# Patient Record
Sex: Female | Born: 1970 | Race: White | Hispanic: No | Marital: Single | Smoking: Never smoker
Health system: Southern US, Community
[De-identification: ages and names within clinical notes are randomized; demographics above are authoritative.]

## PROBLEM LIST (undated history)

## (undated) DIAGNOSIS — S82892A Other fracture of left lower leg, initial encounter for closed fracture: Secondary | ICD-10-CM

## (undated) HISTORY — PX: ABDOMINAL HYSTERECTOMY: SHX81

---

## 1997-11-10 ENCOUNTER — Ambulatory Visit (HOSPITAL_COMMUNITY): Admission: RE | Admit: 1997-11-10 | Discharge: 1997-11-10 | Payer: Self-pay | Admitting: Family Medicine

## 1997-11-13 ENCOUNTER — Emergency Department (HOSPITAL_COMMUNITY): Admission: EM | Admit: 1997-11-13 | Discharge: 1997-11-13 | Payer: Self-pay | Admitting: Emergency Medicine

## 1998-08-08 ENCOUNTER — Encounter: Payer: Self-pay | Admitting: Emergency Medicine

## 1998-08-08 ENCOUNTER — Emergency Department (HOSPITAL_COMMUNITY): Admission: EM | Admit: 1998-08-08 | Discharge: 1998-08-08 | Payer: Self-pay | Admitting: Emergency Medicine

## 1998-08-11 ENCOUNTER — Inpatient Hospital Stay (HOSPITAL_COMMUNITY): Admission: RE | Admit: 1998-08-11 | Discharge: 1998-08-13 | Payer: Self-pay | Admitting: Orthopedic Surgery

## 1998-08-11 ENCOUNTER — Encounter: Payer: Self-pay | Admitting: Orthopedic Surgery

## 2003-07-09 ENCOUNTER — Other Ambulatory Visit: Admission: RE | Admit: 2003-07-09 | Discharge: 2003-07-09 | Payer: Self-pay | Admitting: Obstetrics and Gynecology

## 2003-12-01 ENCOUNTER — Ambulatory Visit (HOSPITAL_COMMUNITY): Admission: RE | Admit: 2003-12-01 | Discharge: 2003-12-01 | Payer: Self-pay | Admitting: Obstetrics and Gynecology

## 2004-04-24 HISTORY — PX: CHOLECYSTECTOMY: SHX55

## 2004-10-24 ENCOUNTER — Other Ambulatory Visit: Admission: RE | Admit: 2004-10-24 | Discharge: 2004-10-24 | Payer: Self-pay | Admitting: Obstetrics and Gynecology

## 2006-10-03 ENCOUNTER — Ambulatory Visit: Payer: Self-pay | Admitting: Internal Medicine

## 2006-10-03 LAB — CONVERTED CEMR LAB
ALT: 20 units/L (ref 0–40)
AST: 23 units/L (ref 0–37)
Albumin: 3.7 g/dL (ref 3.5–5.2)
Alkaline Phosphatase: 59 units/L (ref 39–117)
Basophils Absolute: 0.4 10*3/uL — ABNORMAL HIGH (ref 0.0–0.1)
Basophils Relative: 4.5 % — ABNORMAL HIGH (ref 0.0–1.0)
Bilirubin, Direct: 0.1 mg/dL (ref 0.0–0.3)
Eosinophils Absolute: 0.1 10*3/uL (ref 0.0–0.6)
Eosinophils Relative: 1.7 % (ref 0.0–5.0)
HCT: 41.3 % (ref 36.0–46.0)
Hemoglobin: 14.7 g/dL (ref 12.0–15.0)
Lipase: 21 units/L (ref 11.0–59.0)
Lymphocytes Relative: 21.5 % (ref 12.0–46.0)
MCHC: 35.6 g/dL (ref 30.0–36.0)
MCV: 87.7 fL (ref 78.0–100.0)
Monocytes Absolute: 0.9 10*3/uL — ABNORMAL HIGH (ref 0.2–0.7)
Monocytes Relative: 9.9 % (ref 3.0–11.0)
Neutro Abs: 5.4 10*3/uL (ref 1.4–7.7)
Neutrophils Relative %: 62.4 % (ref 43.0–77.0)
Platelets: 291 10*3/uL (ref 150–400)
RBC: 4.71 M/uL (ref 3.87–5.11)
RDW: 12.3 % (ref 11.5–14.6)
Total Bilirubin: 0.7 mg/dL (ref 0.3–1.2)
Total Protein: 6.8 g/dL (ref 6.0–8.3)
WBC: 8.6 10*3/uL (ref 4.5–10.5)

## 2006-10-04 ENCOUNTER — Ambulatory Visit: Payer: Self-pay | Admitting: Internal Medicine

## 2006-12-05 ENCOUNTER — Encounter: Admission: RE | Admit: 2006-12-05 | Discharge: 2006-12-05 | Payer: Self-pay | Admitting: Internal Medicine

## 2007-01-22 ENCOUNTER — Ambulatory Visit: Payer: Self-pay | Admitting: Internal Medicine

## 2007-02-01 ENCOUNTER — Ambulatory Visit: Payer: Self-pay | Admitting: Internal Medicine

## 2007-02-01 ENCOUNTER — Encounter: Payer: Self-pay | Admitting: Internal Medicine

## 2007-02-01 DIAGNOSIS — K449 Diaphragmatic hernia without obstruction or gangrene: Secondary | ICD-10-CM | POA: Insufficient documentation

## 2007-03-07 ENCOUNTER — Ambulatory Visit (HOSPITAL_COMMUNITY): Admission: RE | Admit: 2007-03-07 | Discharge: 2007-03-07 | Payer: Self-pay | Admitting: Surgery

## 2007-03-07 ENCOUNTER — Encounter (INDEPENDENT_AMBULATORY_CARE_PROVIDER_SITE_OTHER): Payer: Self-pay | Admitting: Surgery

## 2009-01-04 ENCOUNTER — Emergency Department (HOSPITAL_COMMUNITY): Admission: EM | Admit: 2009-01-04 | Discharge: 2009-01-04 | Payer: Self-pay | Admitting: Emergency Medicine

## 2009-04-24 HISTORY — PX: OTHER SURGICAL HISTORY: SHX169

## 2010-03-03 ENCOUNTER — Encounter (INDEPENDENT_AMBULATORY_CARE_PROVIDER_SITE_OTHER): Payer: Self-pay | Admitting: Obstetrics and Gynecology

## 2010-03-03 ENCOUNTER — Ambulatory Visit (HOSPITAL_COMMUNITY): Admission: RE | Admit: 2010-03-03 | Discharge: 2010-03-04 | Payer: Self-pay | Admitting: Obstetrics and Gynecology

## 2010-07-05 LAB — CBC
HCT: 42.8 % (ref 36.0–46.0)
MCH: 30.8 pg (ref 26.0–34.0)
MCHC: 33.7 g/dL (ref 30.0–36.0)
MCV: 91.6 fL (ref 78.0–100.0)
Platelets: 223 10*3/uL (ref 150–400)
Platelets: 239 10*3/uL (ref 150–400)
RDW: 13.1 % (ref 11.5–15.5)
RDW: 13.5 % (ref 11.5–15.5)
WBC: 12.8 10*3/uL — ABNORMAL HIGH (ref 4.0–10.5)
WBC: 9.3 10*3/uL (ref 4.0–10.5)

## 2010-07-05 LAB — COMPREHENSIVE METABOLIC PANEL
AST: 18 U/L (ref 0–37)
Albumin: 3.2 g/dL — ABNORMAL LOW (ref 3.5–5.2)
BUN: 6 mg/dL (ref 6–23)
Creatinine, Ser: 0.77 mg/dL (ref 0.4–1.2)
GFR calc Af Amer: >60 mL/min (ref >60)
Total Protein: 5.7 g/dL — ABNORMAL LOW (ref 6.0–8.3)

## 2010-07-05 LAB — SURGICAL PCR SCREEN: MRSA, PCR: NEGATIVE

## 2010-07-29 LAB — POCT URINALYSIS DIP (DEVICE)
Bilirubin Urine: NEGATIVE
Glucose, UA: NEGATIVE mg/dL
Nitrite: NEGATIVE
Specific Gravity, Urine: 1.01 (ref 1.005–1.030)

## 2010-09-06 NOTE — Assessment & Plan Note (Signed)
[facility]                         GASTROENTEROLOGY OFFICE NOTE   Brandi Perkins, Brandi Perkins                      MRN:          [id number]  DATE:10/03/2006                            DOB:          May 10, 1970    Brandi Perkins is a very nice 41 year old patient of Dr. Aida Puffer who  comes in with intermittent right upper quadrant abdominal pain.  The  pain is localized to the right costal margin, radiating laterally into  the back to the shoulder blade.  It occurs post prandially within 20  minutes of eating, mostly fatty meals or rich foods.  It has only once  woke her up at night.  Her symptoms are usually daytime.  There is a  strong family history of gallbladder disease in her mother, who had  diseased gallbladder, two sisters who had gallstones, her father, who  also had a cholecystectomy five months ago for stones, and his sisters  and her cousins  from father's side.  She denies a history of  gastroesophageal reflux.  She denies dysphagia or odynophagia.   MEDICATIONS:  1. Ortho-Micronor birth control 28-day supply.  2. Advil p.r.n.   Past history significant for laparoscopic surgeries for endometriosis in  August, 2005.   FAMILY HISTORY:  Heart disease in grandmother, breast cancer in aunt.  No family history of colon cancer.   SOCIAL HISTORY:  Single.  Works as an Administrator, arts.  Lives with her parents.  She  does not smoke.  Does not drink alcohol.   REVIEW OF SYSTEMS:  Eyeglasses, otherwise negative.   PHYSICAL EXAMINATION:  VITAL SIGNS:  Blood pressure 116/68, pulse 76.  Weight 199 pounds.  GENERAL:  She was alert, oriented, in no distress, somewhat overweight.  LUNGS:  Clear to auscultation.  NECK:  Supple.  No thyroid enlargement.  COR:  Normal S1 and S2.  ABDOMEN:  Soft with normoactive bowel sounds.  Very tender, overlying  the liver edge.  The liver edge is somewhat tender on inspiration.  There is no hepatomegaly.  Right lower quadrant was  normal.  RECTAL:  Not done.  EXTREMITIES:  No edema.   IMPRESSION:  A 40 year old white female with right upper quadrant  abdominal discomfort occurring post prandially, consistent with biliary  dysfunction, most likely cholelithiasis or possible acalculous  cholecystitis.  She has a strong family history of both sides of her  family.  The differential diagnosis also includes peptic ulcer disease,  Helicobacter pylori gastritis, hiatal hernia, or irritable bowel  syndrome.   PLAN:  1. Upper abdominal ultrasound.  2. Low fat diet.  3. Liver function tests, lipase today as well as CBC.  Depending on      the results of the upper abdominal ultrasound, she may need upper      endoscopy or HIDA scan.     Brandi Perkins. Brandi Chance, MD  Electronically Signed    DMB/MedQ  DD: 10/03/2006  DT: 10/04/2006  Job #: [id number]   cc:   Aida Puffer

## 2010-09-06 NOTE — Op Note (Signed)
NAME:  Brandi Perkins, Brandi Perkins               ACCOUNT NO.:  [id number]   MEDICAL RECORD NO.:  [id number]          PATIENT TYPE:  AMB   LOCATION:  DAY                          FACILITY:  [facility]   PHYSICIAN:  Ardeth Sportsman, MD     DATE OF BIRTH:  March 31, 1971   DATE OF PROCEDURE:  03/07/2007  DATE OF DISCHARGE:                               OPERATIVE REPORT   PRIMARY CARE PHYSICIAN:  Aida Puffer, MD.   GASTROENTEROLOGIST:  Hedwig Morton. Juanda Chance, MD.   PREOPERATIVE DIAGNOSIS:  Biliary dyskinesia.   POSTOPERATIVE DIAGNOSES:  1. Biliary dyskinesia.  2. 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  3. Steatohepatosis.   PROCEDURE PERFORMED:  Laparoscopic cholecystectomy and intraoperative  cholangiogram.   SURGEON:  Ardeth Sportsman, MD.   ASSISTANT:  Alfonse Ras, MD.   ANESTHESIA:  1. General anesthesia.  2. Local anesthetic in a field block around all port sites.   SPECIMENS:  Gallbladder.   DRAINS:  None.   ESTIMATED BLOOD LOSS:  Less than 5 ml.   COMPLICATIONS:  None apparent.   INDICATIONS:  [name] is a pleasant, 40 year old female, who has had  intermittent episodes of right upper quadrant abdominal pain that had  been intermittently worsening.  She has had an aggressive  gastroenterological workup that has otherwise been unrevealing.  Based  upon concerns, Dr. Juanda Chance sent the patient to me for a surgical  evaluation.  She had an abnormal gallbladder ejection fraction by HIDA  scan with reproduction of symptoms strongly suggested of biliary  dyskinesia despite the absence of stones.   The anatomy and physiology of hepatobiliary and pancreatic function was  discussed.  The pathophysiology of biliary dyskinesia with worsening,  debilitating pain and a calculus cholecystitis and other risks were  discussed.  Options were discussed, and recommendation was made for a  laparoscopic cholecystectomy with intraoperative cholangiogram.  Risks  such as stroke, heart attack, deep venous  thrombosis, pulmonary  embolism, and death were discussed.  Risks such as bleeding, need for  transfusion, wound infection, abscess, injury to other organs,  persistent pain (arguing if the gallbladder is not the true etiology),  and other risks were discussed.  Her risks of bile duct injury resulting  in the need of endoscopic extending or percutaneous drainage, or  operative reconstruction were discussed as well.  Questions answered and  she agreed to proceed.   OPERATIVE FINDINGS:  She did have some gallbladder wall thickening  concerning for chronic cholecystitis.  There was no evidence of any  active infection.  She did have a fatty liver consistent with  steatohepatosis.   DESCRIPTION OF PROCEDURE:  Informed consent was confirmed.  The patient  urinated just prior to going to the operating room.  She had sequential  compression devices active during the entire case.  She underwent  general anesthesia without any difficulty.  She was placed supine with  both arms tucked.  Her abdomen was prepped and draped in a sterile  fashion.   Entry was gained into the abdomen with the patient in steep reverse  Trendelenburg to right sit up with an  optimal entry technique placing a  5-mm port in the right upper quadrant.  Capnoperitoneum to 15 mmHg  provided good abdominal wall insufflation.  Under direct visualization,  5-mm ports were placed in the right flank and through the umbilicus, and  a 10-mm port was placed in the subxiphoid region.   The gallbladder fundus was grasped and elevated cephalad.  The  peritoneal coverage between the gallbladder and liver was freed off  using controlled cautery.  A circumferential dissection was done at the  infundibulum of the gallbladder and isolating two structures going from  the gallbladder down to the porta hepatis and the cystic artery with  anterior and posterior branches in the cystic duct, consistent with a  classic critical view.   One  clip on the gallbladder side of the anterior and posterior branches  and two clips slightly proximal were made on the cystic artery and the  anterior branch of the cystic artery was transected.  The posterior  branch was released using cautery since the clips were proximal to it to  a good result.  This left one structure going from the gallbladder down  to the porta hepatis and the cystic duct.  One clip on the infundibulum  was made and a partial cystotomy was performed.   A 5-French cholangiocatheter was placed through the right upper quadrant  stab incision, flushed, and entered in the cystic duct.  The  cholangiogram was run using dilute radiopaque contrast and continuous  fluoroscopy.  Contrast flowed well from a helical side branch consistent  with a cystic duct cannulization.  Contrast flowed well into the right  and left intrahepatic chains, across the common hepatic and bile ducts  with a slightly kinked ampulla, but good flow into the duodenum  consistent with a normal cholangiogram.  The cholangiocatheter was  removed.  Four clips were made in the cystic duct stumps as there was a  nice long cystic duct, and cystic duct transection was completed.   The gallbladder was freed from its remaining attachments on the liver  bed.  During dissection, there was a breach of the gallbladder wall and  there was some spillage of contrast of clear bile.  This was aspirate  controlled.  There were no stones that were exposed.  The gallbladder  was placed in an EndoCatch bag and removed out the subxiphoid port with  no dilation needed.   Copious irrigation of a liter was performed with clear return.  The  clips were intact to the cystic duct and arterial stumps, and hemostasis  was excellent.  There was no leak of bile.  Up to three abdominal ports  were removed, and there was no bleeding from the peritoneal or skin  site.  Capnoperitoneum was actively aspirated out and the umbilical port   was removed.  The skin was closed using a #4-0 Monocryl stitch.  A  sterile dressing was applied.  The patient was extubated and sent to the  recovery room in stable condition.   I explained the operative findings to the patient's mother, postsurgical  instructions were discussed with the patient just prior to surgery and  again with the patient's family.  Questions answered and she expressed  understanding and appreciation.      Ardeth Sportsman, MD  Electronically Signed     SCG/MEDQ  D:  03/07/2007  T:  03/07/2007  Job:  [id number]   cc:   Hedwig Morton. Juanda Chance, MD  [address]  [place]  [place] [address]   Aida Puffer  Fax: [phone]

## 2010-09-06 NOTE — Assessment & Plan Note (Signed)
[facility]                         GASTROENTEROLOGY OFFICE NOTE   NAME:Romer, EASTYN SKALLA                      MRN:          [id number]  DATE:01/22/2007                            DOB:          11/15/1970    Ms. Kern is a 40 year old white female.  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  She has a  very strong family history of gallbladder disease on both sides of her  family. Her ultrasound was normal, the HIDA scan was slightly abnormal  showing decreased ejection fraction, slightly below normal at 48%.  Her  liver function tests, amylase and lipase were normal.  She did well on a  strict low fat diet for about a month but in August started having again  right upper quadrant abdominal pain which became quite severe in the  last 2 weeks radiating to the right back.  There has been no fever,  jaundice.  She has not been able to eat anything for several days,  vomited shortly after eating oatmeal yesterday.   MEDICATIONS:  Advil and Aleve p.r.n. abdominal pain, birth control  pills.   PHYSICAL EXAMINATION:  Blood pressure 116/76, pulse 68, weight 195  pounds which represents 4 pound weight loss since last exam.  She was in  no distress today.  LUNGS:  Clear to auscultation.  COR:  Normal S1, normal S2.  ABDOMEN:  Soft, there is definite tenderness in the right upper quadrant  along the right costal margin, there is tenderness over the liver.  Liver edge itself was  at costal margin.  Left upper quadrant and left  lower quadrants were unremarkable.  Bowel sounds were normoactive.   IMPRESSION:  A 40 year old white female with recurrent right upper  quadrant abdominal pain radiating to the back resembling biliary colic.  She has strong family history of gallbladder disease and has had  slightly abnormal HIDA scan with 48% ejection fraction, normal being  more than 50%.  Her physical exam definitely  suggests tenderness  in  right upper quadrant.  Other than biliary colic for the pain gastritis,  duodenitis or possible irritable bowel syndrome could be another reason.   PLAN:  1. Upper endoscopy scheduled.  2. Referred for surgical consultation.  3. Phenergan 25 mg p.r.n. nausea.  4. Samples of Aciphex 20 mg daily, 2 boxes given to the patient.  5. Vicodin per generic, dispensed 15 one p.o. q.4-6 hours p.r.n.      abdominal pain.     Hedwig Morton. Juanda Chance, MD  Electronically Signed   DMB/MedQ  DD: 01/22/2007  DT: 01/23/2007  Job #: [id number]   cc:   Aida Puffer

## 2010-09-09 NOTE — Op Note (Signed)
NAME:  EMMALINE, WAHBA NO.:  [id number]   MEDICAL RECORD NO.:  [id number]                   PATIENT TYPE:  AMB   LOCATION:  SDC                                  FACILITY:  [facility]   PHYSICIAN:  Carrington Clamp, M.D.              DATE OF BIRTH:  [birth year]   DATE OF PROCEDURE:  12/01/2003  DATE OF DISCHARGE:                                 OPERATIVE REPORT   PREOPERATIVE DIAGNOSIS:  Severe dysmenorrhea.   POSTOPERATIVE DIAGNOSIS:  Probable endometriosis.   OPERATION PERFORMED:  Diagnostic laparoscopy with cautery of endometriosis.   SURGEON:  Carrington Clamp, M.D.   ASSISTANT:  Luvenia Redden, M.D.   ANESTHESIA:  General endotracheal.   ESTIMATED BLOOD LOSS:  Minimal.   IV FLUIDS:   URINE OUTPUT:  Not measured.   COMPLICATIONS:  None.   OPERATIVE FINDINGS:  Approximately 1 cm mass defect in the posterior cul-de-  sac along the posterior vagina.  There was a small clear papule of  endometriosis in this defect.   MEDICATIONS:  None.   PATHOLOGY:  None.   INSTRUMENT COUNT:  Counts correct times three.   DESCRIPTION OF PROCEDURE:  After adequate general anesthesia was achieved,  the patient was prepped and draped in the usual sterile fashion in dorsal  lithotomy position.  A speculum was placed in the vagina and the uterine  manipulator placed in the cervix.  The bladder was emptied with a red rubber  catheter after the speculum had been removed.  Attention was then turned to  the abdomen where a 2 cm infraumbilical incision was made with the scalpel.  The Veress needle was passed into the incision without aspiration of bowel  contents or blood and abdomen insufflated successfully.  The 10 mm trocar  was then placed without complication and a 5 mm trocar placed suprapubically  under direct visualization with camera.  Careful examination of the ovaries,  tubes, broad ligament, peritoneal surfaces, cul-de-sac, uterus, appendix and  liver edge  revealed essentially normal anatomy with the exception of the  uterus being slightly boggy and enlarged and also pulled slightly to the  patient's left.  There was a small _________  defect in the cul-de-sac but  no other evidence of endometriosis.  The appendix appeared normal.  The  ____________ defect was tented up with the tripolar cautery and cauterized  until the defect was essentially obliterated.  This was done with careful  visualization of bowel.  There was also a small possible indirect inguinal  hernia on the right; however, this was comparatively small compared to the  pain and there was no endometriosis seen at the site.  After the inspection  was performed and the cautery of the endometriosis was successful, all  instruments were withdrawn from the abdomen and the abdomen desufflated.  The 10 mm trocar site fascia was closed with a figure-of-eight stitch of 2-0  Vicryl.  The  suprapubic stitch was closed with a subcutaneous stitch of 2-0  Vicryl.  The skin was closed with Dermabond.  All instruments were withdrawn  from the vagina.  The patient tolerated the procedure well and was returned  to recovery room in stable condition.                                               Carrington Clamp, M.D.    MH/MEDQ  D:  12/01/2003  T:  12/01/2003  Job:  [id number]

## 2010-12-05 ENCOUNTER — Encounter: Payer: Self-pay | Admitting: Podiatrist

## 2011-01-31 LAB — HEMOGLOBIN AND HEMATOCRIT, BLOOD: Hemoglobin: 14.5

## 2011-01-31 LAB — PREGNANCY, URINE: Preg Test, Ur: NEGATIVE

## 2011-02-08 ENCOUNTER — Other Ambulatory Visit: Payer: Self-pay | Admitting: Obstetrics and Gynecology

## 2011-12-11 ENCOUNTER — Other Ambulatory Visit: Payer: Self-pay | Admitting: Dermatology

## 2012-11-21 ENCOUNTER — Emergency Department (HOSPITAL_COMMUNITY)
Admission: EM | Admit: 2012-11-21 | Discharge: 2012-11-21 | Disposition: A | Discharge status: Home / Self Care | Payer: BC Managed Care – PPO | Source: Home / Self Care | Attending: Emergency Medicine | Admitting: Emergency Medicine

## 2012-11-21 ENCOUNTER — Encounter (HOSPITAL_COMMUNITY): Payer: Self-pay | Admitting: Emergency Medicine

## 2012-11-21 DIAGNOSIS — K5289 Other specified noninfective gastroenteritis and colitis: Secondary | ICD-10-CM

## 2012-11-21 DIAGNOSIS — K529 Noninfective gastroenteritis and colitis, unspecified: Secondary | ICD-10-CM

## 2012-11-21 LAB — CBC WITH DIFFERENTIAL/PLATELET
Basophils Absolute: 0 10*3/uL (ref 0.0–0.1)
Basophils Relative: 0 % (ref 0–1)
Eosinophils Absolute: 0.1 10*3/uL (ref 0.0–0.7)
Lymphs Abs: 1.4 10*3/uL (ref 0.7–4.0)
MCH: 29.3 pg (ref 26.0–34.0)
MCHC: 33.6 g/dL (ref 30.0–36.0)
Neutrophils Relative %: 55 % (ref 43–77)
Platelets: 263 10*3/uL (ref 150–400)
RBC: 4.75 MIL/uL (ref 3.87–5.11)
RDW: 12.9 % (ref 11.5–15.5)

## 2012-11-21 LAB — POCT URINALYSIS DIP (DEVICE)
Glucose, UA: NEGATIVE mg/dL
Hgb urine dipstick: NEGATIVE
Nitrite: NEGATIVE
Urobilinogen, UA: 0.2 mg/dL (ref 0.0–1.0)

## 2012-11-21 LAB — POCT I-STAT, CHEM 8
Creatinine, Ser: 0.8 mg/dL (ref 0.50–1.10)
Glucose, Bld: 123 mg/dL — ABNORMAL HIGH (ref 70–99)
Hemoglobin: 14.6 g/dL (ref 12.0–15.0)
TCO2: 24 mmol/L (ref 0–100)

## 2012-11-21 MED ORDER — CIPROFLOXACIN HCL 500 MG PO TABS: 500.0000 mg | ORAL_TABLET | Freq: Two times a day (BID) | ORAL | Status: DC

## 2012-11-21 MED ORDER — ONDANSETRON 8 MG PO TBDP: 8.0000 mg | ORAL_TABLET | Freq: Three times a day (TID) | ORAL | Status: DC | PRN

## 2012-11-21 MED ORDER — DIPHENOXYLATE-ATROPINE 2.5-0.025 MG PO TABS: 1.0000 | ORAL_TABLET | Freq: Four times a day (QID) | ORAL | Status: DC | PRN

## 2012-11-21 NOTE — ED Provider Notes (Signed)
Chief Complaint:   Chief Complaint  Patient presents with  . 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 She's had low-grade fever of 99.6. There's been no blood in the vomitus, coffee-ground emesis, or bilious emesis. She denies any blood in the stool or melena. She's had no suspicious exposures, although obviously, she is exposed to sick patients. She denies any suspicious ingestions or recent foreign travel. She has not felt dizzy, weak, lightheaded. Her urine output has been good. She is able to keep liquids down well.  Review of Systems:  Other than noted above, the patient denies any of the following symptoms: Systemic:  No fevers, chills, sweats, weight loss or gain, fatigue, or tiredness. ENT:  No nasal congestion, rhinorrhea, or sore throat. Lungs:  No cough, wheezing, or shortness of breath. Cardiac:  No chest pain, syncope, or presyncope. GI:  No abdominal pain, nausea, vomiting, anorexia, diarrhea, constipation, blood in stool or vomitus. GU:  No dysuria, frequency, or urgency.  PMFSH:  Past medical history, family history, social history, meds, and allergies were reviewed.  She is status post hysterectomy.  Physical Exam:   Vital signs:  BP 127/83  Pulse 87  Temp(Src) 97.9 F (36.6 C) (Oral)  Resp 16  SpO2 99% Filed Vitals:   11/21/12 0842 11/21/12 0945 Supine  11/21/12 0946 Sitting  11/21/12 0946 Standing   BP: 132/80 104/63 105/71 127/83  Pulse: 102 72 80 87  Temp: 97.9 F (36.6 C)     TempSrc: Oral     Resp: 16     SpO2: 99%      General:  Alert and oriented.  In no distress.  Skin warm and dry.  Good skin turgor, brisk capillary refill. ENT:  No scleral icterus, moist mucous membranes, no oral lesions, pharynx clear. Lungs:  Breath sounds clear and equal bilaterally.  No wheezes, rales, or rhonchi. Heart:   Rhythm regular, without extrasystoles.  No gallops or murmers. Abdomen:  Soft, flat, nondistended. There is mild right lower quadrant and right upper quadrant tenderness to palpation without guarding or rebound. No organomegaly or mass. Bowel sounds are hyperactive. Skin: Clear, warm, and dry.  Good turgor.  Brisk capillary refill.  Labs:   Results for orders placed during the hospital encounter of 11/21/12  CBC WITH DIFFERENTIAL      Result Value Range   WBC 4.6  4.0 - 10.5 K/uL   RBC 4.75  3.87 - 5.11 MIL/uL   Hemoglobin 13.9  12.0 - 15.0 g/dL   HCT 06.3  01.6 - 01.0 %   MCV 87.2  78.0 - 100.0 fL   MCH 29.3  26.0 - 34.0 pg   MCHC 33.6  30.0 - 36.0 g/dL   RDW 93.2  35.5 - 73.2 %   Platelets 263  150 - 400 K/uL   Neutrophils Relative % 55  43 - 77 %   Neutro Abs 2.5  1.7 - 7.7 K/uL   Lymphocytes Relative 29  12 - 46 %   Lymphs Abs 1.4  0.7 - 4.0 K/uL   Monocytes Relative 14 (*) 3 - 12 %   Monocytes Absolute 0.6  0.1 - 1.0 K/uL   Eosinophils Relative 2  0 - 5 %   Eosinophils Absolute 0.1  0.0 - 0.7 K/uL   Basophils Relative 0  0 -  1 %   Basophils Absolute 0.0  0.0 - 0.1 K/uL  POCT PREGNANCY, URINE      Result Value Range   Preg Test, Ur NEGATIVE  NEGATIVE  POCT URINALYSIS DIP (DEVICE)      Result Value Range   Glucose, UA NEGATIVE  NEGATIVE mg/dL   Bilirubin Urine SMALL (*) NEGATIVE   Ketones, ur NEGATIVE  NEGATIVE mg/dL   Specific Gravity, Urine >=1.030  1.005 - 1.030   Hgb urine dipstick NEGATIVE  NEGATIVE   pH 6.0  5.0 - 8.0   Protein, ur 30 (*) NEGATIVE mg/dL   Urobilinogen, UA 0.2  0.0 - 1.0 mg/dL   Nitrite NEGATIVE  NEGATIVE   Leukocytes, UA NEGATIVE  NEGATIVE  POCT I-STAT, CHEM 8      Result Value Range   Sodium 140  135 - 145 mEq/L   Potassium 3.5  3.5 - 5.1 mEq/L   Chloride 104  96 - 112 mEq/L   BUN 9  6 - 23 mg/dL   Creatinine, Ser 9.60  0.50 - 1.10 mg/dL   Glucose, Bld 454 (*) 70 - 99 mg/dL   Calcium, Ion 0.98  1.19 - 1.23 mmol/L   TCO2 24  0 - 100 mmol/L    Hemoglobin 14.6  12.0 - 15.0 g/dL   HCT 14.7  82.9 - 56.2 %    A stool culture was obtained.  Course in Urgent Care Center:   She was given Gatorade and kept this down well without any vomiting here at the Urgent Care Center.  Assessment:  The encounter diagnosis was Gastroenteritis.  This appears to be either viral or bacterial. Since has been going on 5 days, I favor bacterial right now. A stool culture is pending. We will start on Cipro. She's also got Lomotil and Zofran for symptomatic relief.  Plan:   1.  The following meds were prescribed:   New Prescriptions   CIPROFLOXACIN (CIPRO) 500 MG TABLET    Take 1 tablet (500 mg total) by mouth every 12 (twelve) hours.   DIPHENOXYLATE-ATROPINE (LOMOTIL) 2.5-0.025 MG PER TABLET    Take 1 tablet by mouth 4 (four) times daily as needed for diarrhea or loose stools.   ONDANSETRON (ZOFRAN ODT) 8 MG DISINTEGRATING TABLET    Take 1 tablet (8 mg total) by mouth every 8 (eight) hours as needed for nausea.   2.  The patient was instructed in symptomatic care and handouts were given. 3.  The patient was told to return if becoming worse in any way, if no better in 2 or 3 days, and given some red flag symptoms such as high fever, worsening pain, or any blood in the vomitus or stool that would indicate earlier return. 4.  The patient was told to take only sips of clear liquids for the next 24 hours and then advance to a b.r.a.t. Diet. 5.  Follow up here if necessary.      Reuben Likes, MD 11/21/12 [phone]

## 2012-11-21 NOTE — ED Notes (Signed)
Pt c/o abd pain onset Sunday... sxs include: f/v/n/d... Last loose stool was today at 0400; at 0500 she had sharp pain across abd... Pain originally started at RLQ w/intermittent sharp pains but it has been gradually getting worse... Denies: urinary sxs, vag d/c, hematuria... Alert w/no signs of acute distress.

## 2012-11-23 ENCOUNTER — Emergency Department (HOSPITAL_COMMUNITY)
Admission: EM | Admit: 2012-11-23 | Discharge: 2012-11-23 | Disposition: A | Discharge status: Home / Self Care | Payer: BC Managed Care – PPO | Attending: Emergency Medicine | Admitting: Emergency Medicine

## 2012-11-23 ENCOUNTER — Emergency Department (HOSPITAL_COMMUNITY): Payer: BC Managed Care – PPO

## 2012-11-23 ENCOUNTER — Encounter (HOSPITAL_COMMUNITY): Payer: Self-pay | Admitting: Physical Medicine and Rehabilitation

## 2012-11-23 DIAGNOSIS — R509 Fever, unspecified: Secondary | ICD-10-CM | POA: Insufficient documentation

## 2012-11-23 DIAGNOSIS — R197 Diarrhea, unspecified: Secondary | ICD-10-CM | POA: Insufficient documentation

## 2012-11-23 DIAGNOSIS — Z90710 Acquired absence of both cervix and uterus: Secondary | ICD-10-CM | POA: Insufficient documentation

## 2012-11-23 DIAGNOSIS — Z9089 Acquired absence of other organs: Secondary | ICD-10-CM | POA: Insufficient documentation

## 2012-11-23 DIAGNOSIS — R112 Nausea with vomiting, unspecified: Secondary | ICD-10-CM | POA: Insufficient documentation

## 2012-11-23 DIAGNOSIS — K529 Noninfective gastroenteritis and colitis, unspecified: Secondary | ICD-10-CM

## 2012-11-23 DIAGNOSIS — R630 Anorexia: Secondary | ICD-10-CM | POA: Insufficient documentation

## 2012-11-23 DIAGNOSIS — K5289 Other specified noninfective gastroenteritis and colitis: Secondary | ICD-10-CM | POA: Insufficient documentation

## 2012-11-23 LAB — COMPREHENSIVE METABOLIC PANEL
AST: 50 U/L — ABNORMAL HIGH (ref 0–37)
BUN: 10 mg/dL (ref 6–23)
CO2: 23 mEq/L (ref 19–32)
Calcium: 9.4 mg/dL (ref 8.4–10.5)
Creatinine, Ser: 0.73 mg/dL (ref 0.50–1.10)
GFR calc non Af Amer: >90 mL/min (ref >90)
Total Bilirubin: 0.2 mg/dL — ABNORMAL LOW (ref 0.3–1.2)

## 2012-11-23 LAB — CBC WITH DIFFERENTIAL/PLATELET
Basophils Relative: 1 % (ref 0–1)
Eosinophils Absolute: 0.1 10*3/uL (ref 0.0–0.7)
Lymphs Abs: 2.2 10*3/uL (ref 0.7–4.0)
MCH: 29.5 pg (ref 26.0–34.0)
MCHC: 33.7 g/dL (ref 30.0–36.0)
MCV: 87.4 fL (ref 78.0–100.0)
Monocytes Absolute: 0.3 10*3/uL (ref 0.1–1.0)
Neutrophils Relative %: 57 % (ref 43–77)
Platelets: 300 10*3/uL (ref 150–400)

## 2012-11-23 LAB — LIPASE, BLOOD: Lipase: 31 U/L (ref 11–59)

## 2012-11-23 MED ORDER — METRONIDAZOLE 500 MG PO TABS: 500.0000 mg | ORAL_TABLET | Freq: Two times a day (BID) | ORAL | Status: DC

## 2012-11-23 MED ORDER — IOHEXOL 300 MG/ML  SOLN
100.0000 mL | Freq: Once | INTRAMUSCULAR | Status: AC | PRN
  Administered 2012-11-23: 100 mL via INTRAVENOUS

## 2012-11-23 MED ORDER — IOHEXOL 300 MG/ML  SOLN
25.0000 mL | Freq: Once | INTRAMUSCULAR | Status: AC | PRN
  Administered 2012-11-23: 25 mL via ORAL

## 2012-11-23 NOTE — ED Provider Notes (Signed)
CSN: [id number]     Arrival date & time 11/23/12  1203 History     First MD Initiated Contact with Patient 11/23/12 1208     Chief Complaint  Patient presents with  . Abdominal Pain  . Emesis  . Diarrhea   (Consider location/radiation/quality/duration/timing/severity/associated sxs/prior Treatment) HPI Comments: 42 year old female with no significant past medical history presents to the emergency department with worsening right lower quadrant abdominal pain since being seen in urgent care 2 days back on July 31. At that time she was diagnosed with gastroenteritis, given Lomotil, Zofran and Cipro. Pain located right lower quadrant, radiating towards the Center of her abdomen rated 5/10. She is still having multiple episodes of diarrhea, has noticed dark tarry stools. Nausea and vomiting began last night. Admits to associated intermittent fever, temperature has been around 100.1. She has a history of total abdominal hysterectomy and cholecystectomy.   The history is provided by the patient.    No past medical history on file. Past Surgical History  Procedure Laterality Date  . Abdominal hysterectomy     No family history on file. History  Substance Use Topics  . Smoking status: Never Smoker   . Smokeless tobacco: Not on file  . Alcohol Use: No   OB History   Grav Para Term Preterm Abortions TAB SAB Ect Mult Living                 Review of Systems  Constitutional: Positive for fever and appetite change. Negative for chills.  Gastrointestinal: Positive for nausea, vomiting, abdominal pain and diarrhea.  Musculoskeletal: Negative for back pain.  All other systems reviewed and are negative.    Allergies  Review of patient's allergies indicates no known allergies.  Home Medications   Current Outpatient Rx  Name  Route  Sig  Dispense  Refill  . ciprofloxacin (CIPRO) 500 MG tablet   Oral   Take 1 tablet (500 mg total) by mouth every 12 (twelve) hours.   20 tablet   0    . diphenoxylate-atropine (LOMOTIL) 2.5-0.025 MG per tablet   Oral   Take 1 tablet by mouth 4 (four) times daily as needed for diarrhea or loose stools.   16 tablet   0   . ondansetron (ZOFRAN ODT) 8 MG disintegrating tablet   Oral   Take 1 tablet (8 mg total) by mouth every 8 (eight) hours as needed for nausea.   20 tablet   0    BP 142/87  Pulse 94  Temp(Src) 98.3 F (36.8 C)  Resp 18  SpO2 99% Physical Exam  Nursing note and vitals reviewed. Constitutional: She is oriented to person, place, and time. She appears well-developed and well-nourished. No distress.  HENT:  Head: Normocephalic and atraumatic.  Mouth/Throat: Oropharynx is clear and moist.  Eyes: Conjunctivae are normal. No scleral icterus.  Neck: Normal range of motion. Neck supple.  Cardiovascular: Normal rate, regular rhythm and normal heart sounds.   Pulmonary/Chest: Effort normal and breath sounds normal.  Abdominal: Soft. Normal appearance and bowel sounds are normal. She exhibits no distension and no mass. There is tenderness in the right lower quadrant. There is tenderness at McBurney's point. There is no rigidity, no rebound and no guarding.  No peritoneal signs.  Musculoskeletal: Normal range of motion. She exhibits no edema.  Neurological: She is alert and oriented to person, place, and time.  Skin: Skin is warm and dry. She is not diaphoretic.  Psychiatric: She has a normal mood and  affect. Her behavior is normal.    ED Course   Procedures (including critical care time)  Labs Reviewed  COMPREHENSIVE METABOLIC PANEL - Abnormal; Notable for the following:    Glucose, Bld 135 (*)    AST 50 (*)    ALT 51 (*)    Total Bilirubin 0.2 (*)    All other components within normal limits  CBC WITH DIFFERENTIAL  LIPASE, BLOOD  URINALYSIS, ROUTINE W REFLEX MICROSCOPIC   Ct Abdomen Pelvis W Contrast  11/23/2012   *RADIOLOGY REPORT*  Clinical Data: Right lower quadrant abdominal pain.  Nausea and vomiting.   Diarrhea.  Tarry stools.  Intermittent fever.  1-week history of these symptoms.  Surgical history includes hysterectomy and cholecystectomy.  CT ABDOMEN AND PELVIS WITH CONTRAST  Technique:  Multidetector CT imaging of the abdomen and pelvis was performed following the standard protocol during bolus administration of intravenous contrast.  Contrast: OMNIPAQUE IOHEXOL 300 MG/ML IV. Oral contrast was also administered.  Comparison: None.  Findings: Wall thickening involving the cecum and the proximal ascending colon with associated edema in the adjacent mesenteric fat and numerous mildly enlarged lymph nodes in the mesentery of the cecum and proximal ascending colon the largest node measures approximately 1.2 x 1.3 cm.  The inflammatory process is centered around the ileocecal valve, with slight wall thickening involving the terminal ileum.  While the appendix is not clearly visualized, an abnormal appendix is not identified.  There is no evidence for colonic inflammation elsewhere.  The descending colon and sigmoid colon are decompressed, accounting for apparent wall thickening in these segments.  There is a very large stool burden throughout the rectum which is mildly distended.  Apart from the distal 2-3 cm of the terminal ileum, the remainder the small bowel is normal in appearance.  Stomach is decompressed and unremarkable.  No ascites.  Mild diffuse hepatic steatosis without focal hepatic parenchymal abnormality.  Normal-appearing spleen, pancreas, adrenal glands, and kidneys.  Gallbladder surgically absent.  No biliary ductal dilation.  No visible aorto-iliofemoral atherosclerosis.  No significant lymphadenopathy.  Retroaortic left renal vein.  Uterus surgically absent.  No adnexal masses or free pelvic fluid. Urinary bladder unremarkable.  Small phleboliths in both sides of the low pelvis.  Bone window images unremarkable.  Visualized lung bases clear apart from the expected dependent atelectasis.   Heart size upper normal.  IMPRESSION:  1.  Inflammatory changes involving the terminal ileum, cecum, and proximal ascending colon with associated regional lymphadenopathy in the adjacent mesentery.  This could be infectious or inflammatory (Crohn's disease).  No evidence of abscess. 2.  Probable rectal fecal impaction. 3.  Mild diffuse hepatic steatosis without focal hepatic parenchymal abnormality.   Original Report Authenticated By: Hulan Saas, M.D.   1. Colitis     MDM  Patient with RLQ abdominal pain. Currently being treated for gastroenteritis with cipro. Symptoms today worse than at urgent care 3 days back. Will obtain CT scan to r/o appendicitis. Labs pending- cbc, cmp, lipase, ua. Patient refusing any pain medications at this time. 2:41 PM CT scan showing inflammatory changes consistent with colitis. She is already on cipro, will add on flagyl. Conservative measures discussed. She will f/u with her PCP. NAD, normal vitals. Still refusing any pain medication. Return precautions discussed. Patient states understanding of plan and is agreeable.   Trevor Mace, PA-C 11/23/12 1442

## 2012-11-23 NOTE — ED Notes (Addendum)
Pt presents to department for evaluation of RLQ pain, N/V and diarrhea. 5/10 pain upon arrival, states pain radiates from RLQ to center of abdomen. Also states several episodes of vomiting and diarrhea. States dark tarry stools and intermittent fever. Symptoms ongoing x1 week, was seen at [facility] and diagnosed with gastritis. Pt is alert and oriented x4.

## 2012-11-23 NOTE — ED Provider Notes (Signed)
Medical screening examination/treatment/procedure(s) were performed by non-physician practitioner and as supervising physician I was immediately available for consultation/collaboration.   Charles B. Bernette Mayers, MD 11/23/12 [phone]

## 2012-11-27 ENCOUNTER — Telehealth (HOSPITAL_COMMUNITY): Payer: Self-pay | Admitting: *Deleted

## 2012-11-27 NOTE — ED Notes (Signed)
Stool culture: Preliminary critical result called to Michiel Cowboy RN as Campylobacter species.  Lab shown to Dr. Artis Flock by me.  He said pt. is adequately treated with Cipro and should be getting better. He said to call for clinical improvement.  I called pt.  Pt. verified x 2 and given preliminary result.  Pt. told she is adequately treated with the Cipro.  Pt. said she feels much better than last week. Pt. Told if fever, diarrhea or bloody stools to return to the ED.  Pt. instructed to drink plenty of fluids to stay hydrated and call if any further problems.  Pt. voiced understanding. Vassie Moselle 11/27/2012

## 2012-11-29 LAB — STOOL CULTURE

## 2014-02-04 ENCOUNTER — Other Ambulatory Visit: Payer: Self-pay | Admitting: Dermatology

## 2015-03-24 ENCOUNTER — Other Ambulatory Visit: Payer: Self-pay | Admitting: Obstetrics and Gynecology

## 2015-03-25 LAB — CYTOLOGY - PAP

## 2015-11-04 ENCOUNTER — Ambulatory Visit: Payer: BLUE CROSS/BLUE SHIELD | Admitting: Podiatry

## 2015-11-12 ENCOUNTER — Encounter: Payer: Self-pay | Admitting: Podiatry

## 2015-11-12 ENCOUNTER — Ambulatory Visit (INDEPENDENT_AMBULATORY_CARE_PROVIDER_SITE_OTHER): Payer: BLUE CROSS/BLUE SHIELD

## 2015-11-12 ENCOUNTER — Ambulatory Visit (INDEPENDENT_AMBULATORY_CARE_PROVIDER_SITE_OTHER): Payer: BLUE CROSS/BLUE SHIELD | Admitting: Podiatry

## 2015-11-12 DIAGNOSIS — M722 Plantar fascial fibromatosis: Secondary | ICD-10-CM

## 2015-11-12 DIAGNOSIS — L600 Ingrowing nail: Secondary | ICD-10-CM | POA: Diagnosis not present

## 2015-11-12 MED ORDER — TRIAMCINOLONE ACETONIDE 10 MG/ML IJ SUSP
10.0000 mg | Freq: Once | INTRAMUSCULAR | Status: AC
  Administered 2015-11-12: 10 mg

## 2015-11-12 MED ORDER — DICLOFENAC SODIUM 75 MG PO TBEC: 75.0000 mg | DELAYED_RELEASE_TABLET | Freq: Two times a day (BID) | ORAL | Status: DC

## 2015-11-12 NOTE — Progress Notes (Signed)
   Subjective:    Patient ID: Brandi HarderJennifer G Perkins, female    DOB: 1971/01/11, 45 y.o.   MRN: [id number]  HPI Chief Complaint  Patient presents with  . Plantar Fasciitis    B/L BOTTOM OF THE HEELS BEEN PAINFUL 2 MONTHS. FEET GET WORSE WHEN PUTTING PRESSURE ON IT. TRIED NO TREATMENT.      Review of Systems  Musculoskeletal: Positive for gait problem.       Objective:   Physical Exam        Assessment & Plan:

## 2015-11-12 NOTE — Patient Instructions (Signed)
Plantar Fasciitis Plantar fasciitis is a painful foot condition that affects the heel. It occurs when the band of tissue that connects the toes to the heel bone (plantar fascia) becomes irritated. This can happen after exercising too much or doing other repetitive activities (overuse injury). The pain from plantar fasciitis can range from mild irritation to severe pain that makes it difficult for you to walk or move. The pain is usually worse in the morning or after you have been sitting or lying down for a while. CAUSES This condition may be caused by:  Standing for long periods of time.  Wearing shoes that do not fit.  Doing high-impact activities, including running, aerobics, and ballet.  Being overweight.  Having an abnormal way of walking (gait).  Having tight calf muscles.  Having high arches in your feet.  Starting a new athletic activity. SYMPTOMS The main symptom of this condition is heel pain. Other symptoms include:  Pain that gets worse after activity or exercise.  Pain that is worse in the morning or after resting.  Pain that goes away after you walk for a few minutes. DIAGNOSIS This condition may be diagnosed based on your signs and symptoms. Your health care provider will also do a physical exam to check for:  A tender area on the bottom of your foot.  A high arch in your foot.  Pain when you move your foot.  Difficulty moving your foot. You may also need to have imaging studies to confirm the diagnosis. These can include:  X-rays.  Ultrasound.  MRI. TREATMENT  Treatment for plantar fasciitis depends on the severity of the condition. Your treatment may include:  Rest, ice, and over-the-counter pain medicines to manage your pain.  Exercises to stretch your calves and your plantar fascia.  A splint that holds your foot in a stretched, upward position while you sleep (night splint).  Physical therapy to relieve symptoms and prevent problems in the  future.  Cortisone injections to relieve severe pain.  Extracorporeal shock wave therapy (ESWT) to stimulate damaged plantar fascia with electrical impulses. It is often used as a last resort before surgery.  Surgery, if other treatments have not worked after 12 months. HOME CARE INSTRUCTIONS  Take medicines only as directed by your health care provider.  Avoid activities that cause pain.  Roll the bottom of your foot over a bag of ice or a bottle of cold water. Do this for 20 minutes, 3-4 times a day.  Perform simple stretches as directed by your health care provider.  Try wearing athletic shoes with air-sole or gel-sole cushions or soft shoe inserts.  Wear a night splint while sleeping, if directed by your health care provider.  Keep all follow-up appointments with your health care provider. PREVENTION   Do not perform exercises or activities that cause heel pain.  Consider finding low-impact activities if you continue to have problems.  Lose weight if you need to. The best way to prevent plantar fasciitis is to avoid the activities that aggravate your plantar fascia. SEEK MEDICAL CARE IF:  Your symptoms do not go away after treatment with home care measures.  Your pain gets worse.  Your pain affects your ability to move or do your daily activities.   This information is not intended to replace advice given to you by your health care provider. Make sure you discuss any questions you have with your health care provider.   Document Released: 01/03/2001 Document Revised: 12/30/2014 Document Reviewed: 02/18/2014 Elsevier   Interactive Patient Education 2016 [employer]  

## 2015-11-15 NOTE — Progress Notes (Signed)
Subjective:     Patient ID: Brandi Perkins, female   DOB: June 02, 1970, 45 y.o.   MRN: [id number]  HPI patient presents with significant discomfort plantar aspect of the heel region bilateral and states it's been going on for several months with no history of injury and mild increase in activity levels   Review of Systems  All other systems reviewed and are negative.      Objective:   Physical Exam  Constitutional: She is oriented to person, place, and time.  Cardiovascular: Intact distal pulses.   Musculoskeletal: Normal range of motion.  Neurological: She is oriented to person, place, and time.  Skin: Skin is warm.  Nursing note and vitals reviewed.  neurovascular status intact muscle strength adequate range of motion within normal limits with patient found to have severe discomfort in the plantar heel region bilateral with inflammation fluid around the medial band. There is depression of the arch noted bilateral and patient has good digital perfusion and is well oriented 3     Assessment:     Inflammatory fasciitis bilateral heels with moderate depression of the arch and instability    Plan:     H&P x-rays reviewed and today I injected the plantar fascia bilateral 3 mg Kenalog 5 mg Xylocaine and applied fascial brace bilateral along with diclofenac 75 mg twice a day. Reappoint for [place] to recheck again in the next several weeks  X-ray report indicated spur formation with no indications of stress fracture arthritis

## 2015-12-01 ENCOUNTER — Ambulatory Visit (INDEPENDENT_AMBULATORY_CARE_PROVIDER_SITE_OTHER): Payer: BLUE CROSS/BLUE SHIELD | Admitting: Podiatry

## 2015-12-01 ENCOUNTER — Encounter: Payer: Self-pay | Admitting: Podiatry

## 2015-12-01 DIAGNOSIS — L600 Ingrowing nail: Secondary | ICD-10-CM | POA: Diagnosis not present

## 2015-12-01 DIAGNOSIS — M722 Plantar fascial fibromatosis: Secondary | ICD-10-CM

## 2015-12-01 NOTE — Patient Instructions (Signed)

## 2015-12-03 NOTE — Progress Notes (Signed)
Subjective:     Patient ID: Brandi HarderJennifer G Scarlett, female   DOB: [birth year], 45 y.o.   MRN: [id number]  HPI patient presents for removal of the hallux and second nails on the right foot stating they are painful thick and she cannot cut them   Review of Systems     Objective:   Physical Exam  Neurovascular status intact with significant reduction of pain in the plantar heel right with severely thickened hallux second nails right over left foot that are damaged and painful    Assessment:     Improved plantar fasciitis at this time with damaged hallux second nail right foot    Plan:     Reviewed both conditions and for the plantar fasciitis we will currently utilize continued supportive shoes not going barefoot and stretching. For the nails I've recommended removal of the permanent nature and I explained procedure and risk and patient wants surgery. I went ahead and I infiltrated the left hallux and second digit with 120 mg Xylocaine Marcaine mixture and remove the hallux and second nails exposing matrix and applying 5 applications to the hallux and 3 applications to the second digit of phenol 30 seconds followed by alcohol lavage and sterile dressing. Gave instructions on soaks and reappoint

## 2015-12-14 ENCOUNTER — Telehealth: Payer: Self-pay | Admitting: *Deleted

## 2015-12-14 NOTE — Telephone Encounter (Signed)
Left message for patient at [phone] (Home #) to check to see how they were doing from their ingrown toenail procedure that was performed on Wednesday, December 01, 2015. Waiting for a response.

## 2015-12-15 ENCOUNTER — Encounter: Payer: Self-pay | Admitting: Podiatry

## 2015-12-15 ENCOUNTER — Ambulatory Visit (INDEPENDENT_AMBULATORY_CARE_PROVIDER_SITE_OTHER): Payer: BLUE CROSS/BLUE SHIELD | Admitting: Podiatry

## 2015-12-15 VITALS — Temp 99.5°F

## 2015-12-15 DIAGNOSIS — L03011 Cellulitis of right finger: Secondary | ICD-10-CM

## 2015-12-15 MED ORDER — CEPHALEXIN 500 MG PO CAPS: 500.0000 mg | ORAL_CAPSULE | Freq: Two times a day (BID) | ORAL | 1 refills | Status: DC

## 2015-12-15 NOTE — Progress Notes (Signed)
Subjective:     Patient ID: Brandi Perkins, female   DOB: 1970/07/20, 45 y.o.   MRN: [id number]  HPI patient presents stating I have some discoloration of my right big toe and I was concerned about infection as [place] the beach for [place]   Review of Systems     Objective:   Physical Exam Neurovascular status intact muscle strength adequate with slight redness in the proximal portion of the nailbed right extending to the interphalangeal joint with no streaking or proximal edema erythema drainage noted    Assessment:     Probability for low-grade paronychia infection secondary to exposure    Plan:     Reviewed continued soaks Neosporin usage and placed on cephalexin 500 mg twice a day for 7 days and encouraged to call us or come in if any issues should occur

## 2015-12-16 ENCOUNTER — Ambulatory Visit: Payer: BLUE CROSS/BLUE SHIELD | Admitting: Podiatry

## 2017-04-18 ENCOUNTER — Other Ambulatory Visit: Payer: Self-pay | Admitting: Obstetrics and Gynecology

## 2017-04-18 DIAGNOSIS — R928 Other abnormal and inconclusive findings on diagnostic imaging of breast: Secondary | ICD-10-CM

## 2017-04-23 ENCOUNTER — Other Ambulatory Visit: Payer: Self-pay | Admitting: Obstetrics and Gynecology

## 2017-04-23 ENCOUNTER — Ambulatory Visit
Admission: RE | Admit: 2017-04-23 | Discharge: 2017-04-23 | Disposition: A | Discharge status: Home / Self Care | Payer: 59 | Source: Ambulatory Visit | Attending: Obstetrics and Gynecology | Admitting: Obstetrics and Gynecology

## 2017-04-23 DIAGNOSIS — R928 Other abnormal and inconclusive findings on diagnostic imaging of breast: Secondary | ICD-10-CM

## 2017-04-26 ENCOUNTER — Other Ambulatory Visit: Payer: Self-pay | Admitting: Obstetrics and Gynecology

## 2017-04-26 DIAGNOSIS — N63 Unspecified lump in unspecified breast: Secondary | ICD-10-CM

## 2017-10-22 ENCOUNTER — Ambulatory Visit: Payer: 59 | Admitting: Podiatry

## 2017-10-22 ENCOUNTER — Encounter: Payer: Self-pay | Admitting: Podiatry

## 2017-10-22 ENCOUNTER — Other Ambulatory Visit: Payer: 59

## 2017-10-22 DIAGNOSIS — L600 Ingrowing nail: Secondary | ICD-10-CM

## 2017-10-23 NOTE — Progress Notes (Signed)
Subjective:   Patient ID: Brandi HarderJennifer G Baumbach, female   DOB: 47 y.o.   MRN: [id number]   HPI Patient presents with thickened painful hallux and second nail left that she cannot take care of and she is tried wider shoes and other modalities and the ones in the right foot did well   ROS      Objective:  Physical Exam  Neurovascular status intact with patient's hallux and second nail left being quite thickened and moderately painful when palpated     Assessment:  Abnormal nail condition hallux second left that are thickened and painful     Plan:  Recommended nail removal and explained procedure and risk and patient signed consent form.  I infiltrated the left second toe and big toe 120 mg like Marcaine mixture sterile prep done and then under sterile instrumentation I remove the nails exposed matrix and applied phenol for applications 30 seconds followed by alcohol lavage and sterile dressing.  Gave instructions on soaks and reappoint

## 2017-11-15 ENCOUNTER — Ambulatory Visit: Payer: 59 | Admitting: Podiatry

## 2017-11-15 ENCOUNTER — Encounter: Payer: Self-pay | Admitting: Podiatry

## 2017-11-15 DIAGNOSIS — L03032 Cellulitis of left toe: Secondary | ICD-10-CM | POA: Diagnosis not present

## 2017-11-15 MED ORDER — AZITHROMYCIN 250 MG PO TABS: ORAL_TABLET | ORAL | 0 refills | Status: DC

## 2017-11-15 NOTE — Progress Notes (Signed)
Subjective:   Patient ID: Brandi Perkins, female   DOB: 47 y.o.   MRN: [id number]   HPI Patient is concerned about the look of the nailbeds on the left big toe second toe that was removed several weeks ago.  States that there is been some redness and there is still drainage and they are mildly tender and while they do seem to be improving it seems awfully slow as well as she can remember   ROS      Objective:  Physical Exam  Neurovascular status is intact with crusted hallux and second nailbeds left with slight proximal erythema noted that is localized with no active infection noted     Assessment:  Localized paronychia infection left hallux second nail that does not appear to have proximal spread point      Plan:  Localized process which I reviewed with her today and today as precautionary measure placed on a Z-Pak for 5 days given instructions on continued soaks and allowing the area to crust over.  I am assuming these will heal uneventfully and patient will let us know if any issues occur.

## 2018-01-03 ENCOUNTER — Ambulatory Visit: Payer: 59

## 2018-01-03 ENCOUNTER — Other Ambulatory Visit: Payer: Self-pay | Admitting: Obstetrics and Gynecology

## 2018-01-03 ENCOUNTER — Ambulatory Visit
Admission: RE | Admit: 2018-01-03 | Discharge: 2018-01-03 | Disposition: A | Discharge status: Home / Self Care | Payer: BLUE CROSS/BLUE SHIELD | Source: Ambulatory Visit | Attending: Obstetrics and Gynecology | Admitting: Obstetrics and Gynecology

## 2018-01-03 DIAGNOSIS — N6310 Unspecified lump in the right breast, unspecified quadrant: Secondary | ICD-10-CM

## 2018-01-03 DIAGNOSIS — N63 Unspecified lump in unspecified breast: Secondary | ICD-10-CM

## 2018-04-12 ENCOUNTER — Other Ambulatory Visit: Payer: Self-pay | Admitting: Obstetrics and Gynecology

## 2018-04-12 DIAGNOSIS — N6310 Unspecified lump in the right breast, unspecified quadrant: Secondary | ICD-10-CM

## 2018-04-29 ENCOUNTER — Ambulatory Visit
Admission: RE | Admit: 2018-04-29 | Discharge: 2018-04-29 | Disposition: A | Discharge status: Home / Self Care | Payer: BLUE CROSS/BLUE SHIELD | Source: Ambulatory Visit | Attending: Obstetrics and Gynecology | Admitting: Obstetrics and Gynecology

## 2018-04-29 DIAGNOSIS — N6310 Unspecified lump in the right breast, unspecified quadrant: Secondary | ICD-10-CM

## 2018-08-11 ENCOUNTER — Emergency Department (HOSPITAL_COMMUNITY)
Admission: EM | Admit: 2018-08-11 | Discharge: 2018-08-12 | Disposition: A | Discharge status: Home / Self Care | Payer: BLUE CROSS/BLUE SHIELD | Attending: Emergency Medicine | Admitting: Emergency Medicine

## 2018-08-11 ENCOUNTER — Emergency Department (HOSPITAL_COMMUNITY): Payer: BLUE CROSS/BLUE SHIELD

## 2018-08-11 ENCOUNTER — Encounter (HOSPITAL_COMMUNITY): Payer: Self-pay | Admitting: Emergency Medicine

## 2018-08-11 ENCOUNTER — Other Ambulatory Visit: Payer: Self-pay

## 2018-08-11 DIAGNOSIS — S9305XA Dislocation of left ankle joint, initial encounter: Secondary | ICD-10-CM | POA: Insufficient documentation

## 2018-08-11 DIAGNOSIS — Y999 Unspecified external cause status: Secondary | ICD-10-CM | POA: Insufficient documentation

## 2018-08-11 DIAGNOSIS — S82842A Displaced bimalleolar fracture of left lower leg, initial encounter for closed fracture: Secondary | ICD-10-CM | POA: Insufficient documentation

## 2018-08-11 DIAGNOSIS — Y939 Activity, unspecified: Secondary | ICD-10-CM | POA: Insufficient documentation

## 2018-08-11 DIAGNOSIS — W010XXA Fall on same level from slipping, tripping and stumbling without subsequent striking against object, initial encounter: Secondary | ICD-10-CM | POA: Insufficient documentation

## 2018-08-11 DIAGNOSIS — Y929 Unspecified place or not applicable: Secondary | ICD-10-CM | POA: Diagnosis not present

## 2018-08-11 DIAGNOSIS — Z79899 Other long term (current) drug therapy: Secondary | ICD-10-CM | POA: Diagnosis not present

## 2018-08-11 DIAGNOSIS — S99912A Unspecified injury of left ankle, initial encounter: Secondary | ICD-10-CM | POA: Diagnosis present

## 2018-08-11 DIAGNOSIS — S82899A Other fracture of unspecified lower leg, initial encounter for closed fracture: Secondary | ICD-10-CM

## 2018-08-11 MED ORDER — MIDAZOLAM HCL 2 MG/2ML IJ SOLN
INTRAMUSCULAR | Status: AC
  Administered 2018-08-11: 2 mg
  Filled 2018-08-11: qty 10

## 2018-08-11 MED ORDER — ONDANSETRON HCL 4 MG/2ML IJ SOLN
4.0000 mg | Freq: Once | INTRAMUSCULAR | Status: AC
  Administered 2018-08-11: 4 mg via INTRAVENOUS
  Filled 2018-08-11: qty 2

## 2018-08-11 MED ORDER — PROPOFOL 10 MG/ML IV BOLUS
1.0000 mg/kg | Freq: Once | INTRAVENOUS | Status: AC
  Administered 2018-08-11: 50 mg via INTRAVENOUS
  Filled 2018-08-11: qty 20

## 2018-08-11 MED ORDER — SODIUM CHLORIDE 0.9 % IV SOLN
INTRAVENOUS | Status: AC | PRN
  Administered 2018-08-11: 999 mL/h via INTRAVENOUS

## 2018-08-11 MED ORDER — OXYCODONE-ACETAMINOPHEN 5-325 MG PO TABS: 1.0000 | ORAL_TABLET | ORAL | 0 refills | Status: DC | PRN

## 2018-08-11 MED ORDER — MIDAZOLAM HCL (PF) 10 MG/2ML IJ SOLN: 10.0000 mg | Freq: Once | INTRAMUSCULAR | Status: DC

## 2018-08-11 MED ORDER — HYDROMORPHONE HCL 1 MG/ML IJ SOLN
1.0000 mg | Freq: Once | INTRAMUSCULAR | Status: AC
  Administered 2018-08-11: 1 mg via INTRAVENOUS
  Filled 2018-08-11: qty 1

## 2018-08-11 NOTE — Progress Notes (Signed)
Orthopedic Tech Progress Note Patient Details:  Brandi Perkins 04-Apr-1971 [id number]  Ortho Devices Type of Ortho Device: Short leg splint Ortho Device/Splint Interventions: Adjustment, Application, Ordered   Post Interventions Patient Tolerated: Fair Instructions Provided: Care of device, Adjustment of device   Norva Karvonen T 08/11/2018, 10:17 PM

## 2018-08-11 NOTE — ED Provider Notes (Addendum)
[facility] EMERGENCY DEPARTMENT Provider Note   CSN: [id number] Arrival date & time: 08/11/18  2007    History   Chief Complaint Chief Complaint  Patient presents with  . Ankle Pain    HPI Brandi Perkins is a 48 y.o. female.     The history is provided by the patient. No language interpreter was used.  Ankle Pain  Location:  Ankle Time since incident:  1 hour Injury: yes   Mechanism of injury: fall   Ankle location:  L ankle Pain details:    Quality:  Aching   Radiates to:  Does not radiate   Severity:  Severe   Onset quality:  Sudden   Duration:  1 hour   Timing:  Constant   Progression:  Unchanged Chronicity:  New Dislocation: no   Foreign body present:  No foreign bodies Prior injury to area:  No Relieved by:  Nothing Worsened by:  Nothing Ineffective treatments:  None tried Associated symptoms: decreased ROM   Pt fell in loose gravel and injured her ankle   History reviewed. No pertinent past medical history.  Patient Active Problem List   Diagnosis Date Noted  . HIATAL HERNIA 02/01/2007    Past Surgical History:  Procedure Laterality Date  . ABDOMINAL HYSTERECTOMY       OB History   No obstetric history on file.      Home Medications    Prior to Admission medications   Medication Sig Start Date End Date Taking? Authorizing Provider  acetaminophen (TYLENOL) 500 MG tablet Take 1,000 mg by mouth every 6 (six) hours as needed for headache (pain).   Yes [provider]  ibuprofen (ADVIL) 200 MG tablet Take 600 mg by mouth every 6 (six) hours as needed for headache (pain).   Yes [provider]    Family History No family history on file.  Social History Social History   Tobacco Use  . Smoking status: Never Smoker  . Smokeless tobacco: Never Used  Substance Use Topics  . Alcohol use: No  . Drug use: No     Allergies   Patient has no known allergies.   Review of Systems Review of Systems   Musculoskeletal: Positive for arthralgias and myalgias.  All other systems reviewed and are negative.    Physical Exam Updated Vital Signs BP 110/69   Pulse (!) 117   Temp 97.7 F (36.5 C) (Oral)   Resp 18   Ht  (1.626 m)   Wt 97.5 kg   SpO2 93%   BMI 36.90 kg/m   Physical Exam Vitals signs reviewed.  HENT:     Head: Normocephalic and atraumatic.     Nose: Nose normal.     Mouth/Throat:     Mouth: Mucous membranes are moist.  Eyes:     Pupils: Pupils are equal, round, and reactive to light.  Neck:     Musculoskeletal: Normal range of motion.  Cardiovascular:     Rate and Rhythm: Normal rate and regular rhythm.     Pulses: Normal pulses.  Pulmonary:     Effort: Pulmonary effort is normal.  Abdominal:     General: Abdomen is flat.  Musculoskeletal:        General: Swelling, tenderness and deformity present.  Skin:    General: Skin is warm.  Neurological:     General: No focal deficit present.     Mental Status: She is alert.  Psychiatric:  Mood and Affect: Mood normal.      ED Treatments / Results  Labs (all labs ordered are listed, but only abnormal results are displayed) Labs Reviewed - No data to display  EKG None  Radiology Dg Ankle Complete Left  Result Date: 08/11/2018 CLINICAL DATA:  Fall, deformity EXAM: LEFT ANKLE COMPLETE - 3+ VIEW COMPARISON:  None. FINDINGS: There is a fracture dislocation at the left ankle. Fractures through the distal fibular metaphysis, medial malleolus and likely posterior malleolus. The talus is dislocated posteriorly relative to the talus. IMPRESSION: Fracture dislocation of the left ankle as above. Electronically Signed   By: Charlett NoseKevin  Dover M.D.   On: 08/11/2018 21:34   Ct Ankle Left Wo Contrast  Result Date: 08/12/2018 CLINICAL DATA:  Ankle fracture EXAM: CT OF THE LEFT ANKLE WITHOUT CONTRAST TECHNIQUE: Multidetector CT imaging of the left ankle was performed according to the standard protocol. Multiplanar CT  image reconstructions were also generated. COMPARISON:  Plain films 08/11/2018 FINDINGS: Comminuted displaced fractures through the distal fibular metaphysis. Displaced fractures through the medial malleolus and posterior distal tibia also noted. There is subluxation of the talus posteriorly relative to the tibia, best seen on the sagittal images. Widening of the anterior and medial ankle mortise. No visible talar fracture. IMPRESSION: Displaced comminuted distal fibular fracture. Displaced medial malleolar and posterior tibial fractures. Continued posterior subluxation of the talus relative to the tibia. Electronically Signed   By: Charlett NoseKevin  Dover M.D.   On: 08/12/2018 01:48   Dg Ankle Left Port  Result Date: 08/11/2018 CLINICAL DATA:  Postreduction EXAM: PORTABLE LEFT ANKLE - 2 VIEW COMPARISON:  08/11/2018 FINDINGS: Interval reduction of the fracture dislocation of the left ankle. No dislocation currently. Distal fibular fracture fragments remain mildly displaced but markedly improved in alignment. Improved alignment of the distal tibial fractures. IMPRESSION: Interval reduction and improved alignment.  Overlying cast material. Electronically Signed   By: Charlett NoseKevin  Dover M.D.   On: 08/11/2018 22:43    Procedures .Ortho Injury Treatment Date/Time: 08/11/2018 10:18 PM Performed by: Elson AreasSofia, Jesstin Studstill K, PA-C Authorized by: Elson AreasSofia, Jetson Pickrel K, PA-C  Splint type: short leg  Reduction of fracture Date/Time: 08/12/2018 12:28 PM Performed by: Elson AreasSofia, Siddhi Dornbush K, PA-C Authorized by: Elson AreasSofia, Sua Spadafora K, PA-C  Consent: Verbal consent obtained. Risks and benefits: risks, benefits and alternatives were discussed Consent given by: patient Patient understanding: patient states understanding of the procedure being performed Patient consent: the patient's understanding of the procedure matches consent given Procedure consent: procedure consent matches procedure scheduled Test results: test results available and properly labeled  Site marked: the operative site was marked Imaging studies: imaging studies available Required items: required blood products, implants, devices, and special equipment available Patient identity confirmed: verbally with patient Time out: Immediately prior to procedure a "time out" was called to verify the correct patient, procedure, equipment, support staff and site/side marked as required. Preparation: Patient was prepped and draped in the usual sterile fashion. Local anesthesia used: no  Anesthesia: Local anesthesia used: no  Sedation: Patient sedated: yes Sedation type: moderate (conscious) sedation Sedatives: propofol and fentanyl Analgesia: hydromorphone  Patient tolerance: Patient tolerated the procedure well with no immediate complications    (including critical care time)  Medications Ordered in ED Medications  propofol (DIPRIVAN) 10 mg/mL bolus/IV push 97.5 mg (has no administration in time range)  midazolam PF (VERSED) injection 10 mg (has no administration in time range)  midazolam (VERSED) 2 MG/2ML injection (has no administration in time range)  HYDROmorphone (DILAUDID) injection 1  mg (1 mg Intravenous Given 08/11/18 2048)  ondansetron (ZOFRAN) injection 4 mg (4 mg Intravenous Given 08/11/18 2043)     Initial Impression / Assessment and Plan / ED Course  I have reviewed the triage vital signs and the nursing notes.  Pertinent labs & imaging results that were available during my care of the patient were reviewed by me and considered in my medical decision making (see chart for details).        MDM  Pt tolerated procedure well. Pt returned to baseline. Pt reports minimal pain.  Splint  Firm normal cap refill   I discussed pt with Dr. Victorino Dike Orthopaedist who request pt have a ct scan,  He advised to have pt call tomorrow for follow up. Pt understands she will need surgery to stabilize ankle. Pt given crutches and advised non weight bearing. Pt given RX for Percocet    Final Clinical Impressions(s) / ED Diagnoses   Final diagnoses:  Ankle fracture  Dislocation of left ankle joint, initial encounter    ED Discharge Orders         Ordered    oxyCODONE-acetaminophen (PERCOCET) 5-325 MG tablet  Every 4 hours PRN     08/11/18 2251        An After Visit Summary was printed and given to the patient.    Elson Areas, PA-C 08/11/18 2251    Elson Areas, PA-C 08/12/18 1234    Eber Hong, MD 08/12/18 1322

## 2018-08-11 NOTE — ED Notes (Signed)
XR notified of need for scan

## 2018-08-11 NOTE — Sedation Documentation (Signed)
Dr Miller out of room  

## 2018-08-11 NOTE — ED Notes (Signed)
Patient transported to CT 

## 2018-08-11 NOTE — Sedation Documentation (Signed)
Ortho tech at bedside 

## 2018-08-11 NOTE — Sedation Documentation (Signed)
X-ray at bedside

## 2018-08-11 NOTE — ED Notes (Signed)
Ortho paged and on the way

## 2018-08-11 NOTE — ED Triage Notes (Signed)
Pt transported by family after falling while walking [place] with nephew @ 61. Obvious deformity noted to LLE, EMS splint in place.

## 2018-08-11 NOTE — ED Notes (Signed)
Pt to xray via stretcher

## 2018-08-11 NOTE — Sedation Documentation (Signed)
EDP at bedside  

## 2018-08-12 ENCOUNTER — Other Ambulatory Visit: Payer: Self-pay

## 2018-08-12 ENCOUNTER — Other Ambulatory Visit (HOSPITAL_COMMUNITY): Payer: Self-pay | Admitting: Orthopedic Surgery

## 2018-08-12 ENCOUNTER — Encounter (HOSPITAL_BASED_OUTPATIENT_CLINIC_OR_DEPARTMENT_OTHER): Payer: Self-pay | Admitting: *Deleted

## 2018-08-12 NOTE — ED Provider Notes (Signed)
  Physical Exam  BP 128/86   Pulse (!) 105   Temp 98.1 F (36.7 C) (Oral)   Resp 16   Ht 1.626 m (5\' 4" )   Wt 97.5 kg   SpO2 98%   BMI 36.90 kg/m   Physical Exam  ED Course/Procedures     .Sedation Date/Time: 08/11/2018 10:20 PM Performed by: Eber Hong, MD Authorized by: Eber Hong, MD   Consent:    Consent obtained:  Verbal   Consent given by:  Patient   Risks discussed:  Allergic reaction, dysrhythmia, inadequate sedation, nausea, prolonged hypoxia resulting in organ damage, prolonged sedation necessitating reversal, respiratory compromise necessitating ventilatory assistance and intubation and vomiting   Alternatives discussed:  Analgesia without sedation, anxiolysis and regional anesthesia Universal protocol:    Procedure explained and questions answered to patient or proxy's satisfaction: yes     Relevant documents present and verified: yes     Test results available and properly labeled: yes     Imaging studies available: yes     Required blood products, implants, devices, and special equipment available: yes     Site/side marked: yes     Immediately prior to procedure a time out was called: yes     Patient identity confirmation method:  Verbally with patient and arm band Indications:    Procedure necessitating sedation performed by:  Different physician Pre-sedation assessment:    Time since last food or drink:  3   ASA classification: class 1 - normal, healthy patient     Neck mobility: normal     Mouth opening:  3 or more finger widths   Thyromental distance:  4 finger widths   Mallampati score:  I - soft palate, uvula, fauces, pillars visible   Pre-sedation assessments completed and reviewed: airway patency, cardiovascular function, hydration status, mental status, nausea/vomiting, pain level, respiratory function and temperature   Immediate pre-procedure details:    Reassessment: Patient reassessed immediately prior to procedure     Reviewed: vital  signs, relevant labs/tests and NPO status     Verified: bag valve mask available, emergency equipment available, intubation equipment available, IV patency confirmed, oxygen available and suction available   Procedure details (see MAR for exact dosages):    Preoxygenation:  Nasal cannula   Sedation:  Propofol and midazolam   Intra-procedure monitoring:  Blood pressure monitoring, cardiac monitor, continuous pulse oximetry, frequent LOC assessments, frequent vital sign checks and continuous capnometry   Intra-procedure events: none     Total Provider sedation time (minutes):  15 Post-procedure details:    Post-sedation assessment completed:  08/11/2018 11:30 PM   Attendance: Constant attendance by certified staff until patient recovered     Recovery: Patient returned to pre-procedure baseline     Post-sedation assessments completed and reviewed: airway patency, cardiovascular function, hydration status, mental status, nausea/vomiting, pain level, respiratory function and temperature     Patient is stable for discharge or admission: yes     Patient tolerance:  Tolerated well, no immediate complications    MDM   Pt stable after reduction and sedation       Eber Hong, MD 08/12/18 1322

## 2018-08-13 ENCOUNTER — Encounter (HOSPITAL_BASED_OUTPATIENT_CLINIC_OR_DEPARTMENT_OTHER): Payer: Self-pay | Admitting: *Deleted

## 2018-08-13 ENCOUNTER — Ambulatory Visit (HOSPITAL_BASED_OUTPATIENT_CLINIC_OR_DEPARTMENT_OTHER)
Admission: RE | Admit: 2018-08-13 | Discharge: 2018-08-13 | Disposition: A | Discharge status: Home / Self Care | Payer: BLUE CROSS/BLUE SHIELD | Attending: Orthopedic Surgery | Admitting: Orthopedic Surgery

## 2018-08-13 ENCOUNTER — Ambulatory Visit (HOSPITAL_BASED_OUTPATIENT_CLINIC_OR_DEPARTMENT_OTHER): Payer: BLUE CROSS/BLUE SHIELD | Admitting: Anesthesiology

## 2018-08-13 ENCOUNTER — Encounter (HOSPITAL_BASED_OUTPATIENT_CLINIC_OR_DEPARTMENT_OTHER)
Admission: RE | Disposition: A | Discharge status: Home / Self Care | Payer: Self-pay | Source: Home / Self Care | Attending: Orthopedic Surgery

## 2018-08-13 DIAGNOSIS — Y9302 Activity, running: Secondary | ICD-10-CM | POA: Diagnosis not present

## 2018-08-13 DIAGNOSIS — S82852A Displaced trimalleolar fracture of left lower leg, initial encounter for closed fracture: Secondary | ICD-10-CM | POA: Diagnosis present

## 2018-08-13 DIAGNOSIS — M93279 Osteochondritis dissecans, unspecified ankle and joints of foot: Secondary | ICD-10-CM | POA: Diagnosis not present

## 2018-08-13 DIAGNOSIS — W010XXA Fall on same level from slipping, tripping and stumbling without subsequent striking against object, initial encounter: Secondary | ICD-10-CM | POA: Diagnosis not present

## 2018-08-13 DIAGNOSIS — E669 Obesity, unspecified: Secondary | ICD-10-CM | POA: Insufficient documentation

## 2018-08-13 DIAGNOSIS — M898X7 Other specified disorders of bone, ankle and foot: Secondary | ICD-10-CM | POA: Insufficient documentation

## 2018-08-13 DIAGNOSIS — Z6837 Body mass index (BMI) 37.0-37.9, adult: Secondary | ICD-10-CM | POA: Diagnosis not present

## 2018-08-13 HISTORY — PX: ORIF ANKLE FRACTURE: SHX5408

## 2018-08-13 HISTORY — DX: Other fracture of left lower leg, initial encounter for closed fracture: S82.892A

## 2018-08-13 SURGERY — OPEN REDUCTION INTERNAL FIXATION (ORIF) ANKLE FRACTURE
Anesthesia: General | Site: Ankle | Laterality: Left

## 2018-08-13 MED ORDER — SCOPOLAMINE 1 MG/3DAYS TD PT72: 1.0000 | MEDICATED_PATCH | Freq: Once | TRANSDERMAL | Status: DC | PRN

## 2018-08-13 MED ORDER — ARTIFICIAL TEARS OPHTHALMIC OINT
TOPICAL_OINTMENT | OPHTHALMIC | Status: AC
  Filled 2018-08-13: qty 3.5

## 2018-08-13 MED ORDER — DOCUSATE SODIUM 100 MG PO CAPS: 100.0000 mg | ORAL_CAPSULE | Freq: Two times a day (BID) | ORAL | 0 refills | Status: AC

## 2018-08-13 MED ORDER — CEFAZOLIN SODIUM-DEXTROSE 2-4 GM/100ML-% IV SOLN
INTRAVENOUS | Status: AC
  Filled 2018-08-13: qty 100

## 2018-08-13 MED ORDER — ONDANSETRON HCL 4 MG/2ML IJ SOLN
INTRAMUSCULAR | Status: DC | PRN
  Administered 2018-08-13: 4 mg via INTRAVENOUS

## 2018-08-13 MED ORDER — ASPIRIN EC 81 MG PO TBEC: 81.0000 mg | DELAYED_RELEASE_TABLET | Freq: Two times a day (BID) | ORAL | 0 refills | Status: AC

## 2018-08-13 MED ORDER — OXYCODONE HCL 5 MG PO TABS: 5.0000 mg | ORAL_TABLET | ORAL | 0 refills | Status: AC | PRN

## 2018-08-13 MED ORDER — BUPIVACAINE-EPINEPHRINE (PF) 0.5% -1:200000 IJ SOLN
INTRAMUSCULAR | Status: AC
  Filled 2018-08-13: qty 30

## 2018-08-13 MED ORDER — SENNA 8.6 MG PO TABS: 2.0000 | ORAL_TABLET | Freq: Two times a day (BID) | ORAL | 0 refills | Status: AC

## 2018-08-13 MED ORDER — MIDAZOLAM HCL 2 MG/2ML IJ SOLN
1.0000 mg | INTRAMUSCULAR | Status: DC | PRN
  Administered 2018-08-13 (×2): 2 mg via INTRAVENOUS

## 2018-08-13 MED ORDER — BUPIVACAINE-EPINEPHRINE (PF) 0.5% -1:200000 IJ SOLN
INTRAMUSCULAR | Status: DC | PRN
  Administered 2018-08-13: 15 mL via PERINEURAL
  Administered 2018-08-13: 25 mL via PERINEURAL

## 2018-08-13 MED ORDER — GLYCOPYRROLATE 0.2 MG/ML IJ SOLN: INTRAMUSCULAR | Status: DC | PRN

## 2018-08-13 MED ORDER — MIDAZOLAM HCL 2 MG/2ML IJ SOLN
INTRAMUSCULAR | Status: AC
  Filled 2018-08-13: qty 2

## 2018-08-13 MED ORDER — ONDANSETRON HCL 4 MG/2ML IJ SOLN
INTRAMUSCULAR | Status: AC
  Filled 2018-08-13: qty 2

## 2018-08-13 MED ORDER — PROPOFOL 10 MG/ML IV BOLUS
INTRAVENOUS | Status: AC
  Filled 2018-08-13: qty 20

## 2018-08-13 MED ORDER — BUPIVACAINE-EPINEPHRINE 0.5% -1:200000 IJ SOLN
INTRAMUSCULAR | Status: DC | PRN
  Administered 2018-08-13: 20 mL

## 2018-08-13 MED ORDER — SODIUM CHLORIDE 0.9 % IV SOLN: INTRAVENOUS | Status: DC

## 2018-08-13 MED ORDER — DEXAMETHASONE SODIUM PHOSPHATE 10 MG/ML IJ SOLN
INTRAMUSCULAR | Status: AC
  Filled 2018-08-13: qty 1

## 2018-08-13 MED ORDER — POVIDONE-IODINE 10 % EX SWAB: 2.0000 "application " | Freq: Once | CUTANEOUS | Status: DC

## 2018-08-13 MED ORDER — FENTANYL CITRATE (PF) 100 MCG/2ML IJ SOLN
INTRAMUSCULAR | Status: AC
  Filled 2018-08-13: qty 2

## 2018-08-13 MED ORDER — LIDOCAINE 2% (20 MG/ML) 5 ML SYRINGE
INTRAMUSCULAR | Status: DC | PRN
  Administered 2018-08-13: 80 mg via INTRAVENOUS

## 2018-08-13 MED ORDER — CEFAZOLIN SODIUM-DEXTROSE 2-4 GM/100ML-% IV SOLN
2.0000 g | INTRAVENOUS | Status: AC
  Administered 2018-08-13: 2 g via INTRAVENOUS

## 2018-08-13 MED ORDER — PROPOFOL 500 MG/50ML IV EMUL
INTRAVENOUS | Status: DC | PRN
  Administered 2018-08-13: 25 ug/kg/min via INTRAVENOUS

## 2018-08-13 MED ORDER — KETOROLAC TROMETHAMINE 30 MG/ML IJ SOLN
INTRAMUSCULAR | Status: DC | PRN
  Administered 2018-08-13: 30 mg via INTRAVENOUS

## 2018-08-13 MED ORDER — LACTATED RINGERS IV SOLN
INTRAVENOUS | Status: DC
  Administered 2018-08-13 (×2): via INTRAVENOUS

## 2018-08-13 MED ORDER — ONDANSETRON HCL 4 MG/2ML IJ SOLN: INTRAMUSCULAR | Status: DC | PRN

## 2018-08-13 MED ORDER — DEXAMETHASONE SODIUM PHOSPHATE 10 MG/ML IJ SOLN
INTRAMUSCULAR | Status: DC | PRN
  Administered 2018-08-13: 5 mg via INTRAVENOUS

## 2018-08-13 MED ORDER — FENTANYL CITRATE (PF) 100 MCG/2ML IJ SOLN
50.0000 ug | INTRAMUSCULAR | Status: AC | PRN
  Administered 2018-08-13 (×3): 50 ug via INTRAVENOUS

## 2018-08-13 MED ORDER — OXYCODONE HCL 5 MG/5ML PO SOLN: 5.0000 mg | Freq: Once | ORAL | Status: DC | PRN

## 2018-08-13 MED ORDER — CHLORHEXIDINE GLUCONATE 4 % EX LIQD: 60.0000 mL | Freq: Once | CUTANEOUS | Status: DC

## 2018-08-13 MED ORDER — PROMETHAZINE HCL 25 MG/ML IJ SOLN: 6.2500 mg | INTRAMUSCULAR | Status: DC | PRN

## 2018-08-13 MED ORDER — 0.9 % SODIUM CHLORIDE (POUR BTL) OPTIME
TOPICAL | Status: DC | PRN
  Administered 2018-08-13: 14:00:00 300 mL

## 2018-08-13 MED ORDER — PROPOFOL 10 MG/ML IV BOLUS
INTRAVENOUS | Status: DC | PRN
  Administered 2018-08-13: 200 mg via INTRAVENOUS

## 2018-08-13 MED ORDER — FENTANYL CITRATE (PF) 100 MCG/2ML IJ SOLN: 25.0000 ug | INTRAMUSCULAR | Status: DC | PRN

## 2018-08-13 MED ORDER — KETOROLAC TROMETHAMINE 30 MG/ML IJ SOLN: INTRAMUSCULAR | Status: DC | PRN

## 2018-08-13 MED ORDER — OXYCODONE HCL 5 MG PO TABS: 5.0000 mg | ORAL_TABLET | Freq: Once | ORAL | Status: DC | PRN

## 2018-08-13 SURGICAL SUPPLY — 88 items
APL PRP STRL LF DISP 70% ISPRP (MISCELLANEOUS) ×1
BANDAGE ESMARK 6X9 LF (GAUZE/BANDAGES/DRESSINGS) ×1 IMPLANT
BIT DRILL 2.5X2.75 QC CALB (BIT) ×3 IMPLANT
BIT DRILL 2.9 CANN QC NONSTRL (BIT) ×3 IMPLANT
BIT DRILL 3.5X5.5 QC CALB (BIT) ×3 IMPLANT
BLADE SURG 15 STRL LF DISP TIS (BLADE) ×4 IMPLANT
BLADE SURG 15 STRL SS (BLADE) ×12
BNDG CMPR 9X4 STRL LF SNTH (GAUZE/BANDAGES/DRESSINGS)
BNDG CMPR 9X6 STRL LF SNTH (GAUZE/BANDAGES/DRESSINGS) ×1
BNDG COHESIVE 4X5 TAN STRL (GAUZE/BANDAGES/DRESSINGS) ×3 IMPLANT
BNDG COHESIVE 6X5 TAN STRL LF (GAUZE/BANDAGES/DRESSINGS) ×3 IMPLANT
BNDG ESMARK 4X9 LF (GAUZE/BANDAGES/DRESSINGS) IMPLANT
BNDG ESMARK 6X9 LF (GAUZE/BANDAGES/DRESSINGS) ×3
CANISTER SUCT 1200ML W/VALVE (MISCELLANEOUS) ×3 IMPLANT
CHLORAPREP W/TINT 26 (MISCELLANEOUS) ×3 IMPLANT
COVER BACK TABLE REUSABLE LG (DRAPES) ×3 IMPLANT
COVER WAND RF STERILE (DRAPES) IMPLANT
CUFF TOURN SGL QUICK 34 (TOURNIQUET CUFF) ×3
CUFF TRNQT CYL 34X4.125X (TOURNIQUET CUFF) ×1 IMPLANT
DECANTER SPIKE VIAL GLASS SM (MISCELLANEOUS) IMPLANT
DRAPE EXTREMITY T 121X128X90 (DISPOSABLE) ×3 IMPLANT
DRAPE OEC MINIVIEW 54X84 (DRAPES) ×3 IMPLANT
DRAPE U-SHAPE 47X51 STRL (DRAPES) ×3 IMPLANT
DRSG MEPITEL 4X7.2 (GAUZE/BANDAGES/DRESSINGS) ×6 IMPLANT
DRSG PAD ABDOMINAL 8X10 ST (GAUZE/BANDAGES/DRESSINGS) ×12 IMPLANT
ELECT REM PT RETURN 9FT ADLT (ELECTROSURGICAL) ×3
ELECTRODE REM PT RTRN 9FT ADLT (ELECTROSURGICAL) ×1 IMPLANT
GAUZE SPONGE 4X4 12PLY STRL (GAUZE/BANDAGES/DRESSINGS) ×3 IMPLANT
GAUZE SPONGE 4X4 12PLY STRL LF (GAUZE/BANDAGES/DRESSINGS) ×3 IMPLANT
GLOVE BIO SURGEON STRL SZ7 (GLOVE) ×3 IMPLANT
GLOVE BIO SURGEON STRL SZ8 (GLOVE) ×3 IMPLANT
GLOVE BIOGEL PI IND STRL 7.0 (GLOVE) ×1 IMPLANT
GLOVE BIOGEL PI IND STRL 7.5 (GLOVE) ×1 IMPLANT
GLOVE BIOGEL PI IND STRL 8 (GLOVE) ×2 IMPLANT
GLOVE BIOGEL PI INDICATOR 7.0 (GLOVE) ×2
GLOVE BIOGEL PI INDICATOR 7.5 (GLOVE) ×2
GLOVE BIOGEL PI INDICATOR 8 (GLOVE) ×4
GLOVE ECLIPSE 8.0 STRL XLNG CF (GLOVE) ×3 IMPLANT
GOWN STRL REUS W/ TWL LRG LVL3 (GOWN DISPOSABLE) IMPLANT
GOWN STRL REUS W/ TWL XL LVL3 (GOWN DISPOSABLE) ×3 IMPLANT
GOWN STRL REUS W/TWL LRG LVL3 (GOWN DISPOSABLE)
GOWN STRL REUS W/TWL XL LVL3 (GOWN DISPOSABLE) ×9
K-WIRE ACE 1.6X6 (WIRE) ×9
KWIRE ACE 1.6X6 (WIRE) ×3 IMPLANT
NEEDLE HYPO 22GX1.5 SAFETY (NEEDLE) ×3 IMPLANT
NS IRRIG 1000ML POUR BTL (IV SOLUTION) ×3 IMPLANT
PACK BASIN DAY SURGERY FS (CUSTOM PROCEDURE TRAY) ×3 IMPLANT
PAD CAST 4YDX4 CTTN HI CHSV (CAST SUPPLIES) ×1 IMPLANT
PADDING CAST ABS 4INX4YD NS (CAST SUPPLIES)
PADDING CAST ABS COTTON 4X4 ST (CAST SUPPLIES) IMPLANT
PADDING CAST COTTON 4X4 STRL (CAST SUPPLIES) ×3
PADDING CAST COTTON 6X4 STRL (CAST SUPPLIES) ×3 IMPLANT
PENCIL BUTTON HOLSTER BLD 10FT (ELECTRODE) ×3 IMPLANT
PLATE ACE 100DE 10H (Plate) ×2 IMPLANT
PLATE ACE 3.5MM 2HOLE (Plate) ×3 IMPLANT
SANITIZER HAND PURELL 535ML FO (MISCELLANEOUS) ×3 IMPLANT
SCREW ACE CAN 4.0 40M (Screw) ×6 IMPLANT
SCREW ACE CAN 4.0 42M (Screw) ×3 IMPLANT
SCREW CORTICAL 3.5MM  12MM (Screw) ×2 IMPLANT
SCREW CORTICAL 3.5MM 12MM (Screw) ×1 IMPLANT
SCREW CORTICAL 3.5MM 14MM (Screw) ×9 IMPLANT
SCREW CORTICAL 3.5MM 18MM (Screw) ×3 IMPLANT
SCREW CORTICAL 3.5MM 22MM (Screw) ×3 IMPLANT
SCREW CORTICAL 3.5MM 36MM (Screw) ×6 IMPLANT
SHEET MEDIUM DRAPE 40X70 STRL (DRAPES) ×3 IMPLANT
SLEEVE SCD COMPRESS KNEE MED (MISCELLANEOUS) ×3 IMPLANT
SPLINT FAST PLASTER 5X30 (CAST SUPPLIES) ×40
SPLINT PLASTER CAST FAST 5X30 (CAST SUPPLIES) ×20 IMPLANT
SPONGE LAP 18X18 RF (DISPOSABLE) ×3 IMPLANT
STOCKINETTE 6  STRL (DRAPES) ×2
STOCKINETTE 6 STRL (DRAPES) ×1 IMPLANT
SUCTION FRAZIER HANDLE 10FR (MISCELLANEOUS) ×2
SUCTION TUBE FRAZIER 10FR DISP (MISCELLANEOUS) ×1 IMPLANT
SUT ETHILON 3 0 PS 1 (SUTURE) ×6 IMPLANT
SUT FIBERWIRE #2 38 T-5 BLUE (SUTURE)
SUT MNCRL AB 3-0 PS2 18 (SUTURE) IMPLANT
SUT VIC AB 0 SH 27 (SUTURE) IMPLANT
SUT VIC AB 2-0 SH 27 (SUTURE) ×6
SUT VIC AB 2-0 SH 27XBRD (SUTURE) ×2 IMPLANT
SUTURE FIBERWR #2 38 T-5 BLUE (SUTURE) IMPLANT
SYR BULB 3OZ (MISCELLANEOUS) ×3 IMPLANT
SYR CONTROL 10ML LL (SYRINGE) ×3 IMPLANT
TOWEL GREEN STERILE FF (TOWEL DISPOSABLE) ×6 IMPLANT
TUBE CONNECTING 20'X1/4 (TUBING) ×1
TUBE CONNECTING 20X1/4 (TUBING) ×2 IMPLANT
UNDERPAD 30X30 (UNDERPADS AND DIAPERS) ×3 IMPLANT
WASHER FLAT ACE (Orthopedic Implant) ×2 IMPLANT
WASHER PLAIN FLAT ACE NS 3PK (Orthopedic Implant) ×1 IMPLANT

## 2018-08-13 NOTE — Progress Notes (Signed)
Assisted Dr. Brock with left, ultrasound guided, popliteal, adductor canal block. Side rails up, monitors on throughout procedure. See vital signs in flow sheet. Tolerated Procedure well. 

## 2018-08-13 NOTE — Anesthesia Procedure Notes (Signed)
Procedure Name: LMA Insertion Date/Time: 08/13/2018 12:43 PM Performed by: Tyrone Nine, CRNA Pre-anesthesia Checklist: Patient identified, Emergency Drugs available, Suction available, Patient being monitored and Timeout performed Patient Re-evaluated:Patient Re-evaluated prior to induction Oxygen Delivery Method: Circle system utilized Preoxygenation: Pre-oxygenation with 100% oxygen Induction Type: IV induction Ventilation: Mask ventilation without difficulty LMA: LMA inserted LMA Size: 4.0 Number of attempts: 1 Placement Confirmation: breath sounds checked- equal and bilateral,  CO2 detector and positive ETCO2 Tube secured with: Tape Dental Injury: Teeth and Oropharynx as per pre-operative assessment  Comments: Small mouth

## 2018-08-13 NOTE — Transfer of Care (Signed)
Immediate Anesthesia Transfer of Care Note  Patient: Brandi Perkins  Procedure(s) Performed: Open treatment of left ankle trimalleolar fracture and syndesmosis disruption with internal fixation (Left Ankle)  Patient Location: PACU  Anesthesia Type:General  Level of Consciousness: sedated and patient cooperative  Airway & Oxygen Therapy: Patient Spontanous Breathing and Patient connected to face mask oxygen  Post-op Assessment: Report given to RN and Post -op Vital signs reviewed and stable  Post vital signs: Reviewed and stable  Last Vitals:  Vitals Value Taken Time  BP 116/68 08/13/2018  2:29 PM  Temp    Pulse 100 08/13/2018  2:30 PM  Resp 17 08/13/2018  2:30 PM  SpO2 100 % 08/13/2018  2:30 PM  Vitals shown include unvalidated device data.  Last Pain:  Vitals:   08/13/18 1002  TempSrc: Oral  PainSc: 4          Complications: No apparent anesthesia complications

## 2018-08-13 NOTE — Discharge Instructions (Addendum)
Brandi Arthurs, MD [facility]  Please read the following information regarding your care after surgery.  Medications  You only need a prescription for the narcotic pain medicine (ex. oxycodone, Percocet, Norco).  All of the other medicines listed below are available over the counter. X resume Ibuprofen for the first 3 days after surgery. X acetominophen (Tylenol) 650 mg every 4-6 hours as you need for minor to moderate pain X oxycodone as prescribed for severe pain  Narcotic pain medicine (ex. oxycodone, Percocet, Vicodin) will cause constipation.  To prevent this problem, take the following medicines while you are taking any pain medicine. X docusate sodium (Colace) 100 mg twice a day X senna (Senokot) 2 tablets twice a day  X To help prevent blood clots, take a baby aspirin (81 mg) twice a day after surgery.  You should also get up every hour while you are awake to move around.    Weight Bearing X Do not bear any weight on the operated leg or foot.  Cast / Splint / Dressing X Keep your splint, cast or dressing clean and dry.  Dont put anything (coat hanger, pencil, etc) down inside of it.  If it gets damp, use a hair dryer on the cool setting to dry it.  If it gets soaked, call the office to schedule an appointment for a cast change.   After your dressing, cast or splint is removed; you may shower, but do not soak or scrub the wound.  Allow the water to run over it, and then gently pat it dry.  Swelling It is normal for you to have swelling where you had surgery.  To reduce swelling and pain, keep your toes above your nose for at least 3 days after surgery.  It may be necessary to keep your foot or leg elevated for several weeks.  If it hurts, it should be elevated.  Follow Up Call my office at [phone] when you are discharged from the hospital or surgery center to schedule an appointment to be seen two weeks after surgery.  Call my office at [phone] if you  develop a fever >101.5 F, nausea, vomiting, bleeding from the surgical site or severe pain.       Regional Anesthesia Blocks  1. Numbness or the inability to move the "blocked" extremity may last from 3-48 hours after placement. The length of time depends on the medication injected and your individual response to the medication. If the numbness is not going away after 48 hours, call your surgeon.  2. The extremity that is blocked will need to be protected until the numbness is gone and the  Strength has returned. Because you cannot feel it, you will need to take extra care to avoid injury. Because it may be weak, you may have difficulty moving it or using it. You may not know what position it is in without looking at it while the block is in effect.  3. For blocks in the legs and feet, returning to weight bearing and walking needs to be done carefully. You will need to wait until the numbness is entirely gone and the strength has returned. You should be able to move your leg and foot normally before you try and bear weight or walk. You will need someone to be with you when you first try to ensure you do not fall and possibly risk injury.  4. Bruising and tenderness at the needle site are common side effects and will resolve in a few  days.  5. Persistent numbness or new problems with movement should be communicated to the surgeon or the [facility] [phone])/ [facility] [phone]).      Post Anesthesia Home Care Instructions  Activity: Get plenty of rest for the remainder of the day. A responsible individual must stay with you for 24 hours following the procedure.  For the next 24 hours, DO NOT: -Drive a car -Advertising copywriterperate machinery -Drink alcoholic beverages -Take any medication unless instructed by your physician -Make any legal decisions or sign important papers.  Meals: Start with liquid foods such as gelatin or soup. Progress to regular foods as  tolerated. Avoid greasy, spicy, heavy foods. If nausea and/or vomiting occur, drink only clear liquids until the nausea and/or vomiting subsides. Call your physician if vomiting continues.  Special Instructions/Symptoms: Your throat may feel dry or sore from the anesthesia or the breathing tube placed in your throat during surgery. If this causes discomfort, gargle with warm salt water. The discomfort should disappear within 24 hours.  If you had a scopolamine patch placed behind your ear for the management of post- operative nausea and/or vomiting:  1. The medication in the patch is effective for 72 hours, after which it should be removed.  Wrap patch in a tissue and discard in the trash. Wash hands thoroughly with soap and water. 2. You may remove the patch earlier than 72 hours if you experience unpleasant side effects which may include dry mouth, dizziness or visual disturbances. 3. Avoid touching the patch. Wash your hands with soap and water after contact with the patch.

## 2018-08-13 NOTE — H&P (Signed)
Brandi Perkins is an 48 y.o. female.   Chief Complaint: Left ankle injury HPI: The patient is a 48 year old female without significant past medical history.  She injured her left ankle a few days ago when she slipped while jogging on gravel.  In the emergency room she was found to have a trimalleolar fracture dislocation with syndesmosis disruption.  She underwent closed reduction and splinting.  She presents now for operative treatment of this displaced and unstable left ankle injury.  Past Medical History:  Diagnosis Date  . Closed left ankle fracture     Past Surgical History:  Procedure Laterality Date  . ABDOMINAL HYSTERECTOMY     complete  . CHOLECYSTECTOMY  2006   laparoscopic  . titanium radial head placement right elbow  2011    History reviewed. No pertinent family history. Social History:  reports that she has never smoked. She has never used smokeless tobacco. She reports that she does not drink alcohol or use drugs.  Allergies: No Known Allergies  Medications Prior to Admission  Medication Sig Dispense Refill  . oxyCODONE-acetaminophen (PERCOCET) 5-325 MG tablet Take 1 tablet by mouth every 4 (four) hours as needed for severe pain. 20 tablet 0  . acetaminophen (TYLENOL) 500 MG tablet Take 1,000 mg by mouth every 6 (six) hours as needed for headache (pain).    Marland Kitchen ibuprofen (ADVIL) 200 MG tablet Take 600 mg by mouth every 6 (six) hours as needed for headache (pain).      No results found for this or any previous visit (from the past 48 hour(s)). Dg Ankle Complete Left  Result Date: 08/11/2018 CLINICAL DATA:  Fall, deformity EXAM: LEFT ANKLE COMPLETE - 3+ VIEW COMPARISON:  None. FINDINGS: There is a fracture dislocation at the left ankle. Fractures through the distal fibular metaphysis, medial malleolus and likely posterior malleolus. The talus is dislocated posteriorly relative to the talus. IMPRESSION: Fracture dislocation of the left ankle as above. Electronically Signed    By: Charlett Nose M.D.   On: 08/11/2018 21:34   Ct Ankle Left Wo Contrast  Result Date: 08/12/2018 CLINICAL DATA:  Ankle fracture EXAM: CT OF THE LEFT ANKLE WITHOUT CONTRAST TECHNIQUE: Multidetector CT imaging of the left ankle was performed according to the standard protocol. Multiplanar CT image reconstructions were also generated. COMPARISON:  Plain films 08/11/2018 FINDINGS: Comminuted displaced fractures through the distal fibular metaphysis. Displaced fractures through the medial malleolus and posterior distal tibia also noted. There is subluxation of the talus posteriorly relative to the tibia, best seen on the sagittal images. Widening of the anterior and medial ankle mortise. No visible talar fracture. IMPRESSION: Displaced comminuted distal fibular fracture. Displaced medial malleolar and posterior tibial fractures. Continued posterior subluxation of the talus relative to the tibia. Electronically Signed   By: Charlett Nose M.D.   On: 08/12/2018 01:48   Dg Ankle Left Port  Result Date: 08/11/2018 CLINICAL DATA:  Postreduction EXAM: PORTABLE LEFT ANKLE - 2 VIEW COMPARISON:  08/11/2018 FINDINGS: Interval reduction of the fracture dislocation of the left ankle. No dislocation currently. Distal fibular fracture fragments remain mildly displaced but markedly improved in alignment. Improved alignment of the distal tibial fractures. IMPRESSION: Interval reduction and improved alignment.  Overlying cast material. Electronically Signed   By: Charlett Nose M.D.   On: 08/11/2018 22:43    ROS no recent fever, chills, nausea, vomiting or changes in her appetite  Blood pressure 114/81, pulse 92, temperature 98.1 F (36.7 C), temperature source Oral, height 5\' 4"  (  1.626 m), weight 99.8 kg, SpO2 95 %. Physical Exam  Well-nourished well-developed woman in no apparent distress.  Alert and oriented x4.  Mood and affect are normal.  Extraocular motions are intact.  Respirations are unlabored.  Gait is  nonweightbearing on the left.  Left ankle is splinted.  The toes have brisk capillary refill.  By report the skin is healthy and intact.  No lymphadenopathy.  Pulses are palpable in the foot.  Sensibility to light touch is intact dorsally and plantarly at the forefoot.  Assessment/Plan Left ankle trimalleolar fracture dislocation with syndesmosis disruption -to the operating room today for open treatment of this displaced and unstable left ankle injury with internal fixation.  The risks and benefits of the alternative treatment options have been discussed in detail.  The patient wishes to proceed with surgery and specifically understands risks of bleeding, infection, nerve damage, blood clots, need for additional surgery, amputation and death.   Toni ArthursJohn Montana Fassnacht, MD 08/13/2018, 12:07 PM

## 2018-08-13 NOTE — Anesthesia Procedure Notes (Signed)
Anesthesia Regional Block: Popliteal block   Pre-Anesthetic Checklist: ,, timeout performed, Correct Patient, Correct Site, Correct Laterality, Correct Procedure, Correct Position, site marked, Risks and benefits discussed,  Surgical consent,  Pre-op evaluation,  At surgeon's request and post-op pain management  Laterality: Left  Prep: chloraprep       Needles:  Injection technique: Single-shot  Needle Type: Echogenic Needle     Needle Length: 10cm  Needle Gauge: 21     Additional Needles:   Narrative:  Start time: 08/13/2018 11:40 AM End time: 08/13/2018 11:43 AM Injection made incrementally with aspirations every 5 mL.  Performed by: Personally  Anesthesiologist: Beryle Lathe, MD  Additional Notes: No pain on injection. No increased resistance to injection. Injection made in 5cc increments. Good needle visualization. Patient tolerated the procedure well.

## 2018-08-13 NOTE — Anesthesia Procedure Notes (Signed)
Procedure Name: LMA Insertion Date/Time: 08/13/2018 1:36 PM Performed by: Tyrone Nine, CRNA Pre-anesthesia Checklist: Patient identified, Emergency Drugs available, Suction available and Patient being monitored LMA: LMA inserted LMA Size: 3.0 Number of attempts: 1

## 2018-08-13 NOTE — Anesthesia Preprocedure Evaluation (Addendum)
Anesthesia Evaluation  Patient identified by MRN, date of birth, ID band Patient awake    Reviewed: Allergy & Precautions, NPO status , Patient's Chart, lab work & pertinent test results  History of Anesthesia Complications Negative for: history of anesthetic complications  Airway Mallampati: II  TM Distance: >3 FB Neck ROM: Full    Dental  (+) Dental Advisory Given, Teeth Intact,    Pulmonary neg pulmonary ROS,    breath sounds clear to auscultation       Cardiovascular negative cardio ROS   Rhythm:Regular Rate:Normal     Neuro/Psych negative neurological ROS  negative psych ROS   GI/Hepatic negative GI ROS, Neg liver ROS,   Endo/Other   Obesity   Renal/GU negative Renal ROS     Musculoskeletal negative musculoskeletal ROS (+)   Abdominal   Peds  Hematology negative hematology ROS (+)   Anesthesia Other Findings   Reproductive/Obstetrics  S/p hysterectomy                            Anesthesia Physical Anesthesia Plan  ASA: II  Anesthesia Plan: General   Post-op Pain Management:  Regional for Post-op pain   Induction: Intravenous  PONV Risk Score and Plan: 3 and Treatment may vary due to age or medical condition, Ondansetron, Dexamethasone and Midazolam  Airway Management Planned: LMA  Additional Equipment: None  Intra-op Plan:   Post-operative Plan: Extubation in OR  Informed Consent: I have reviewed the patients History and Physical, chart, labs and discussed the procedure including the risks, benefits and alternatives for the proposed anesthesia with the patient or authorized representative who has indicated his/her understanding and acceptance.     Dental advisory given  Plan Discussed with: CRNA and Anesthesiologist  Anesthesia Plan Comments:        Anesthesia Quick Evaluation

## 2018-08-13 NOTE — Anesthesia Procedure Notes (Signed)
Anesthesia Regional Block: Adductor canal block   Pre-Anesthetic Checklist: ,, timeout performed, Correct Patient, Correct Site, Correct Laterality, Correct Procedure, Correct Position, site marked, Risks and benefits discussed,  Surgical consent,  Pre-op evaluation,  At surgeon's request and post-op pain management  Laterality: Left  Prep: chloraprep       Needles:  Injection technique: Single-shot  Needle Type: Echogenic Needle     Needle Length: 10cm  Needle Gauge: 21     Additional Needles:   Narrative:  Start time: 08/13/2018 11:35 AM End time: 08/13/2018 11:38 AM Injection made incrementally with aspirations every 5 mL.  Performed by: Personally  Anesthesiologist: Beryle Lathe, MD  Additional Notes: No pain on injection. No increased resistance to injection. Injection made in 5cc increments. Good needle visualization. Patient tolerated the procedure well.

## 2018-08-13 NOTE — Op Note (Signed)
08/13/2018  2:23 PM  PATIENT:  Brandi HarderJennifer G Perkins  48 y.o. female  PRE-OPERATIVE DIAGNOSIS:  left ankle trimalleolar fracture with disruption of syndesmosis  POST-OPERATIVE DIAGNOSIS:  1.  left ankle trimalleolar fracture with stable syndesmosis      2.  Medial talar dome osteochondral lesion  Procedure(s): 1.  Open treatment of left ankle trimalleolar fracture with internal fixation including fixation of the posterior lip 2.  Stress examination of the left ankle under fluoroscopy 3.  AP, mortise and lateral radiographs of the left ankle  SURGEON:  Toni ArthursJohn Demarrio Menges, MD  ASSISTANT: Alfredo MartinezJustin Ollis, PA-C  ANESTHESIA:   General, regional  EBL:  minimal   TOURNIQUET:   Total Tourniquet Time Documented: Thigh (Left) - 77 minutes Total: Thigh (Left) - 77 minutes  COMPLICATIONS:  None apparent  DISPOSITION:  Extubated, awake and stable to recovery.  INDICATION FOR PROCEDURE: The patient is a 48 year old female without significant past medical history.  She injured her ankle over the weekend when she slipped while jogging on gravel.  Radiographs in the emergency room revealed a fracture dislocation of the left ankle.  CT scan reveals a displaced trimalleolar fracture.  She presents now for operative treatment of this displaced and unstable left ankle injury.  The risks and benefits of the alternative treatment options have been discussed in detail.  The patient wishes to proceed with surgery and specifically understands risks of bleeding, infection, nerve damage, blood clots, need for additional surgery, amputation and death.  PROCEDURE IN DETAIL:  After pre operative consent was obtained, and the correct operative site was identified, the patient was brought to the operating room and placed supine on the OR table.  Anesthesia was administered.  Pre-operative antibiotics were administered.  A surgical timeout was taken.  The patient was then turned into the lateral decubitus position on the beanbag.   The left lower extremity was then prepped and draped in standard sterile fashion with a tourniquet around the thigh.  The extremity was elevated and the tourniquet was inflated to 250 mmHg.  A posterior lateral incision was made at the ankle.  Dissection was carried down through the subcutaneous tissues taking care to protect branches of the sural nerve.  The interval between the flexor pollicis longus and peroneals was developed.  The posterior malleolus fracture was identified.  The periosteum was incised and elevated exposing the fracture site.  The fracture was irrigated copiously and cleaned of all hematoma.  Dissection was then carried around laterally through the subcutaneous tissue exposing the fibular fracture.  The fibula fracture was reduced and provisionally held with a lobster claw.  The posterior malleolus fracture was then reduced and provisionally pinned.  Radiographs confirmed appropriate alignment.  A 4 mm partially-threaded cannulated screw with a washer from the [employer] small frag set was placed across the fracture site and was noted to compress the fracture site appropriately.  A 2 hole one third tubular plate was then placed over the apex of the fracture as a buttress plate.  It was secured with bicortical 3.5 mm fully threaded screws.  Attention was then turned to the lateral malleolus.  A 3.5 mm fully threaded lag screw was inserted from posterior to anterior across the fracture site.  It was noted to have excellent purchase and compressed the fracture site appropriately.  A 10 hole one third tubular plate was contoured appropriately.  It was secured distally with 3 unicortical screws and proximally with 3 bicortical screws.  Attention was then turned  to the medial aspect of the ankle where a longitudinal incision was made over the medial malleolus.  Dissection was carried down through the subcutaneous tissues.  The periosteum was incised over the fracture site.  Hematoma was  irrigated and cleared from fracture site.  The medial talar dome was visualized from the fracture site.  There was a small osteochondral lesion at the medial shoulder of the talus.  There was no evidence of exposed bone or fracture.  The frayed cartilage was resected with a rondure.  The fracture was provisionally reduced and held with a tenaculum.  Radiographs confirmed appropriate reduction of the fracture.  The fracture was then secured with 4 mm x 40 mm partially-threaded cannulated screws.  Both were noted to have excellent purchase.  Final AP, mortise and lateral radiographs of the left ankle show appropriate reduction of the medial, lateral and posterior malleolus fractures.  Hardware is appropriately positioned and of the appropriate lengths.  Stress examination was then performed.  Dorsiflexion and external rotation stress was applied to the supinated forefoot.  There was no evidence of widening of the ankle mortise or the syndesmosis.  The wounds were then irrigated copiously.  Deep subcutaneous tissues were approximated with Vicryl.  The skin incisions were closed with running 3-0 nylon.  Sterile dressings were applied followed by a well-padded short leg splint.  The tourniquet was released after application of the dressings.  The patient was awakened from anesthesia and transported to the recovery room in stable condition.  FOLLOW UP PLAN: Nonweightbearing on the left lower extremity for 6 weeks postop.  Follow-up with me in the office in 2 weeks for suture removal and conversion to a cam boot to allow early range of motion.  Aspirin 81 mg p.o. twice daily for DVT prophylaxis.   RADIOGRAPHS: AP, mortise and lateral radiographs of the left ankle are obtained intraoperatively.  These show interval reduction and fixation of the trimalleolar ankle fracture.  Hardware is appropriately positioned and of the appropriate lengths.    Alfredo Martinez PA-C was present and scrubbed for the duration of the  operative case. His assistance was essential in positioning the patient, prepping and draping, gaining and maintaining exposure, performing the operation, closing and dressing the wounds and applying the splint.

## 2018-08-14 ENCOUNTER — Encounter (HOSPITAL_BASED_OUTPATIENT_CLINIC_OR_DEPARTMENT_OTHER): Payer: Self-pay | Admitting: Orthopedic Surgery

## 2018-08-14 NOTE — Anesthesia Postprocedure Evaluation (Signed)
Anesthesia Post Note  Patient: Brandi Perkins  Procedure(s) Performed: Open treatment of left ankle trimalleolar fracture and syndesmosis disruption with internal fixation (Left Ankle)     Patient location during evaluation: PACU Anesthesia Type: General Level of consciousness: awake and alert Pain management: pain level controlled Vital Signs Assessment: post-procedure vital signs reviewed and stable Respiratory status: spontaneous breathing, nonlabored ventilation and respiratory function stable Cardiovascular status: blood pressure returned to baseline and stable Postop Assessment: no apparent nausea or vomiting Anesthetic complications: no    Last Vitals:  Vitals:   08/13/18 1500 08/13/18 1545  BP: 135/84 130/82  Pulse: (!) 101 97  Resp: 16 20  Temp:  36.6 C  SpO2: 100% 96%    Last Pain:  Vitals:   08/13/18 1545  TempSrc:   PainSc: 0-No pain                 Beryle Lathe

## 2018-08-19 NOTE — ED Notes (Signed)
Time out done with 2 EDPs, orhto tech, 2 RNs at bedside. Right name, verified by mrn, name, and birthday. Right procedure, verified by consent. Right site verified by informed consent and x-ray.

## 2019-06-17 ENCOUNTER — Other Ambulatory Visit: Payer: Self-pay | Admitting: Obstetrics and Gynecology

## 2019-06-17 DIAGNOSIS — N6310 Unspecified lump in the right breast, unspecified quadrant: Secondary | ICD-10-CM

## 2019-07-03 ENCOUNTER — Other Ambulatory Visit: Payer: Self-pay

## 2019-07-03 ENCOUNTER — Ambulatory Visit
Admission: RE | Admit: 2019-07-03 | Discharge: 2019-07-03 | Disposition: A | Discharge status: Home / Self Care | Payer: 59 | Source: Ambulatory Visit | Attending: Obstetrics and Gynecology | Admitting: Obstetrics and Gynecology

## 2019-07-03 DIAGNOSIS — N6310 Unspecified lump in the right breast, unspecified quadrant: Secondary | ICD-10-CM

## 2019-11-30 IMAGING — MG DIGITAL DIAGNOSTIC BILATERAL MAMMOGRAM WITH TOMO AND CAD
8 series · 8 of 24 positions shown · non-contrast
Comparison: Previous exam(s).

CLINICAL DATA: 47-year-old female presenting for annual bilateral
mammogram in 1 year follow-up of a probably benign right breast mass
without ultrasound correlate.

EXAM:
DIGITAL DIAGNOSTIC BILATERAL MAMMOGRAM WITH CAD AND TOMO

[R MLO synth-2D]
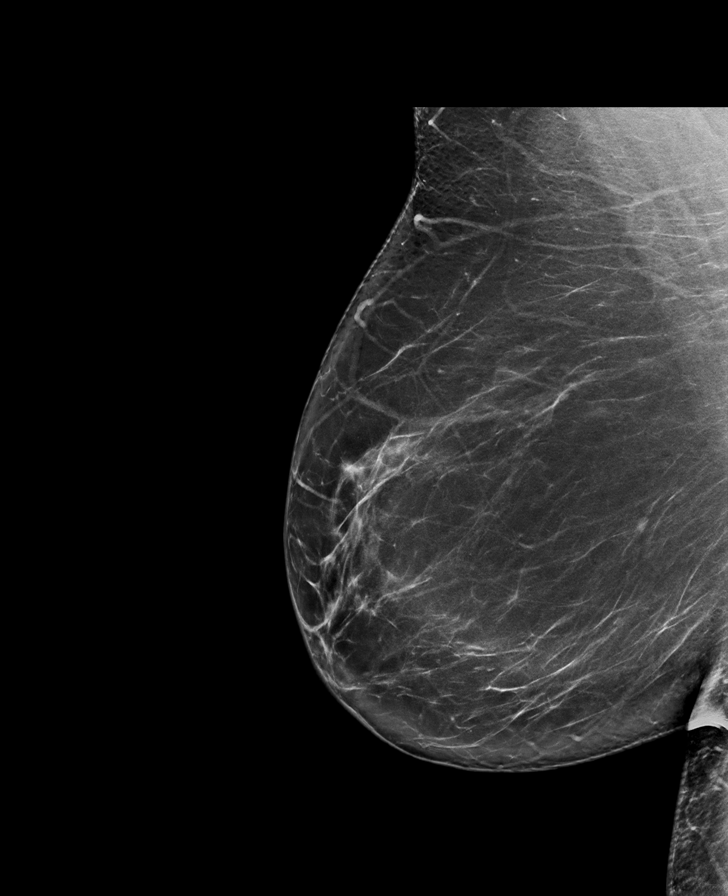

[L MLO synth-2D]
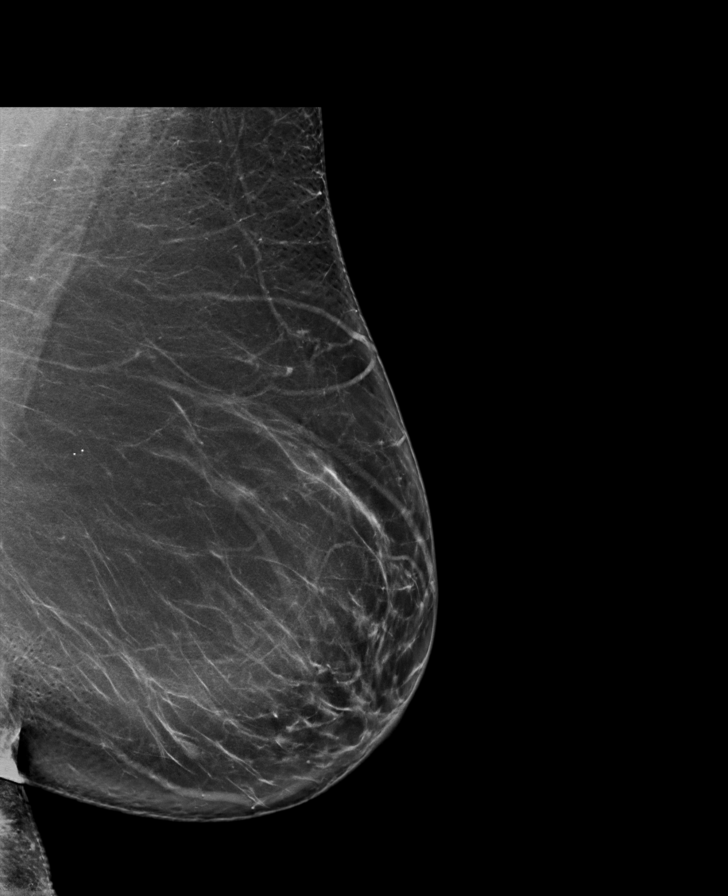

[L CC synth-2D]
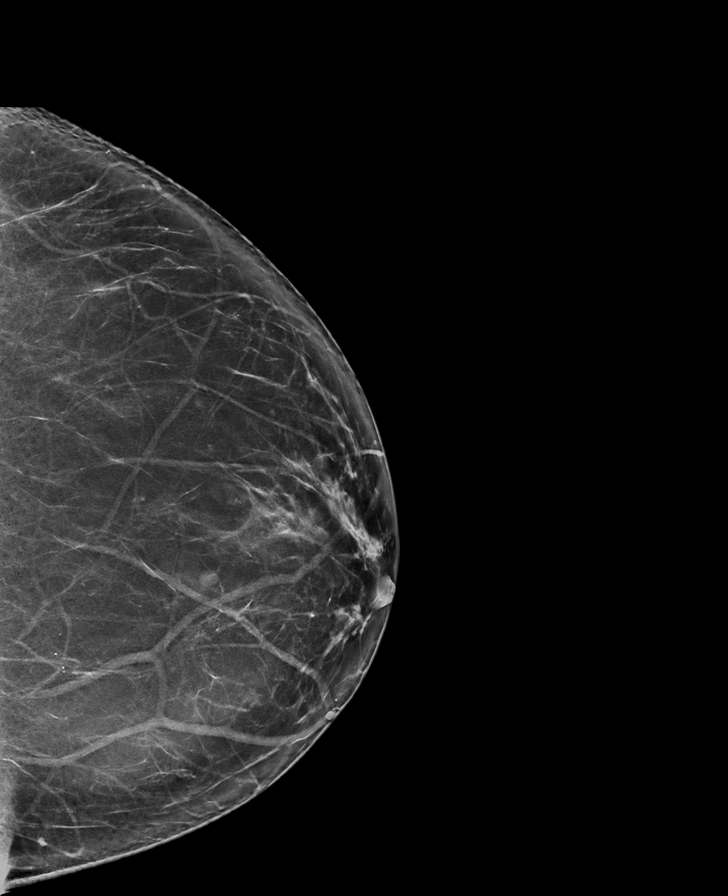

[R CC synth-2D]
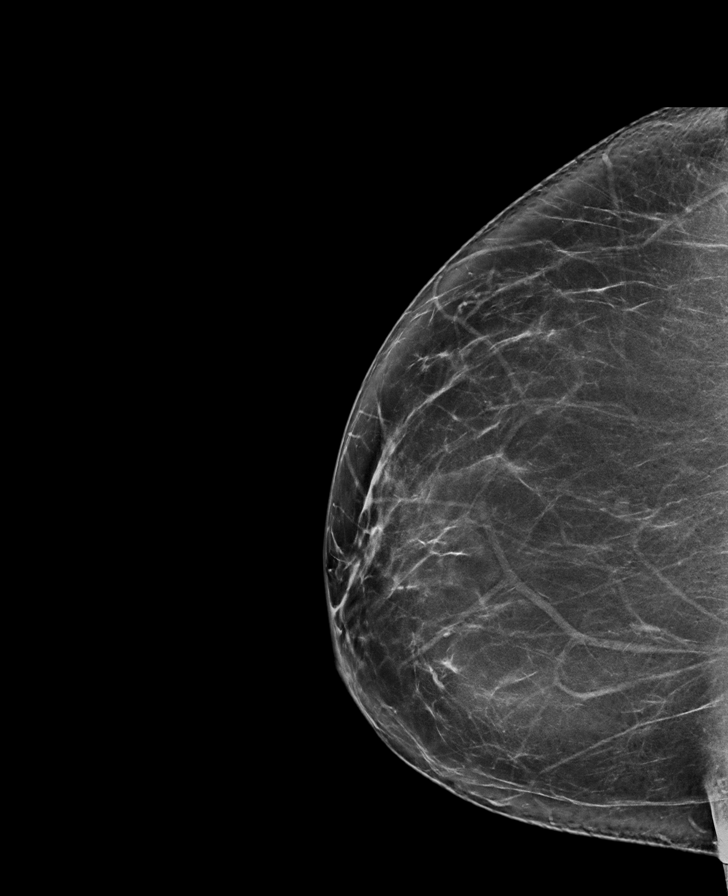

[R MLO tomo · tomo slice 49/96.0]
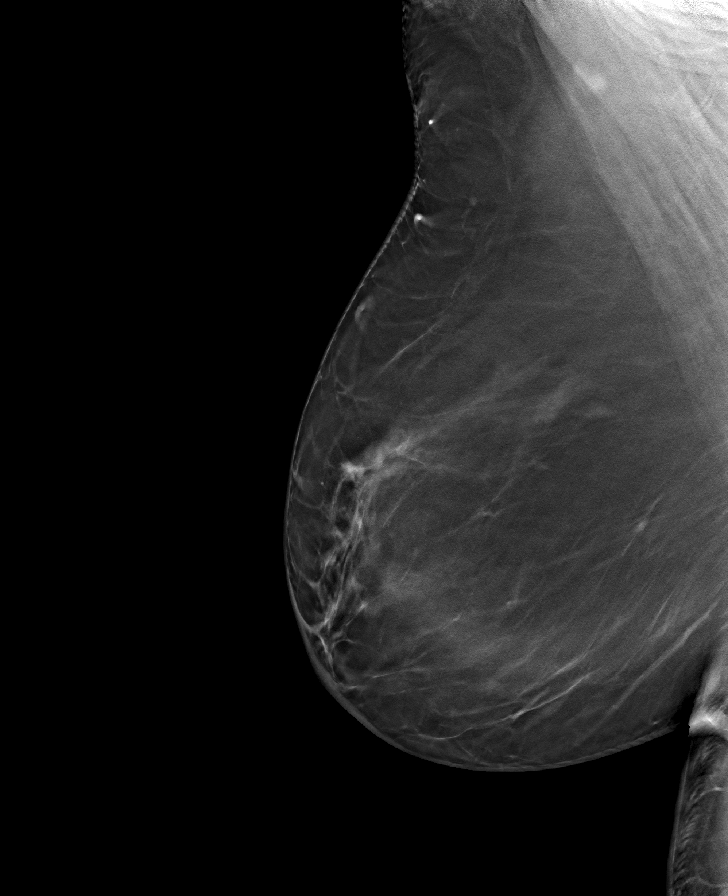

[L MLO tomo · tomo slice 49/96.0]
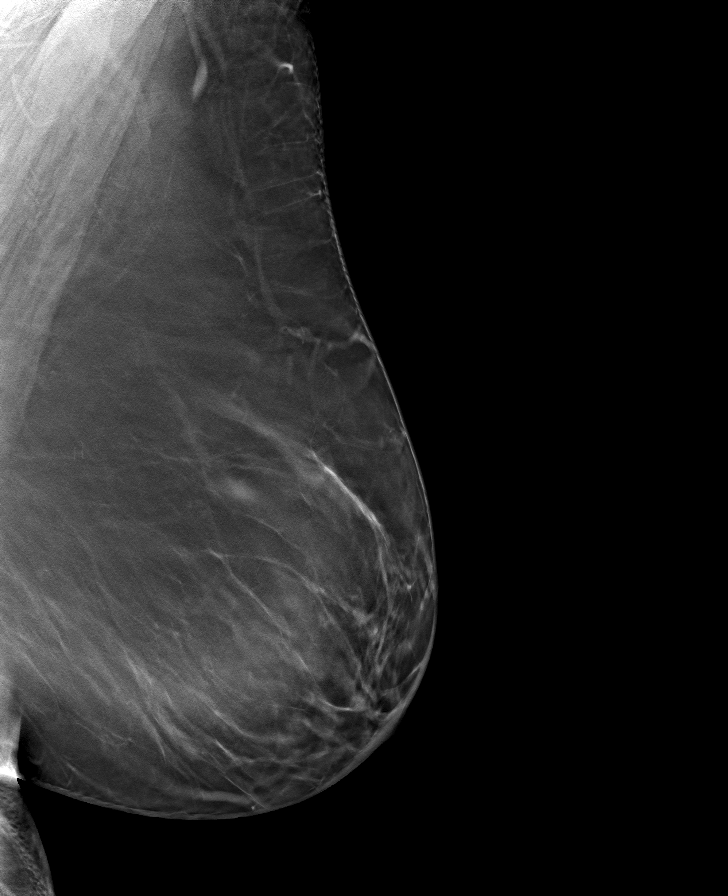

[R CC tomo · tomo slice 43/86.0]
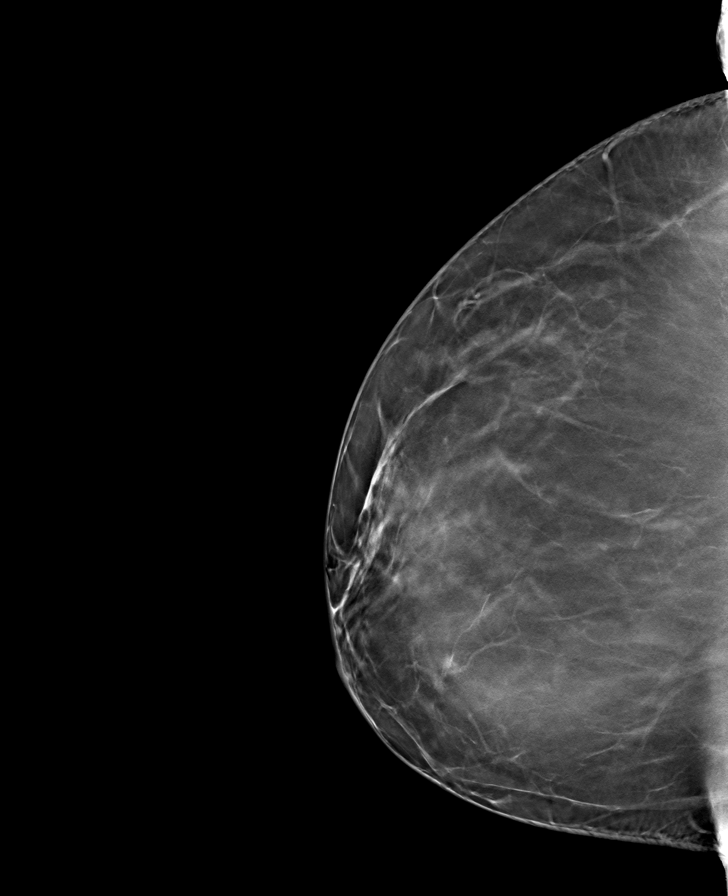

[L CC tomo · tomo slice 42/83.0]
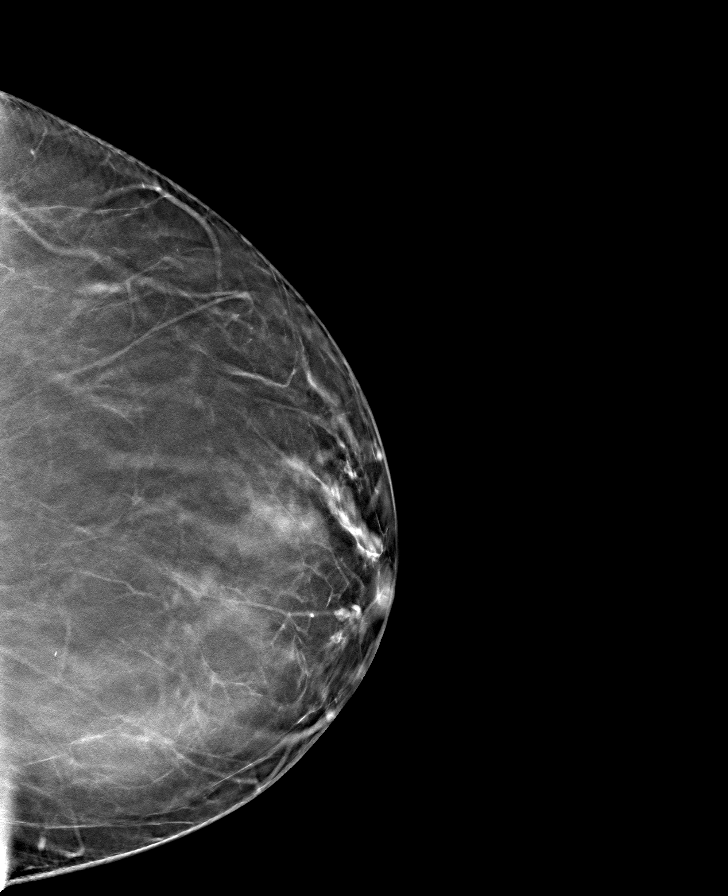

[8 of 24 positions shown; findings below may reference images not displayed]

ACR Breast Density Category b: There are scattered areas of
fibroglandular density.
FINDINGS: Stable appearance of a 6 mm, low-density circumscribed mass in the
lower outer right breast at posterior depth. No new or suspicious
findings are identified within the remainder of the right breast.
Circumscribed mass in the lower inner left breast at middle depth
was previously demonstrated to be a simple cyst. No additional
suspicious findings on the left.

Mammographic images were processed with CAD.
IMPRESSION: 1. Stable, probably benign right breast mass. Recommendation is for
a final follow-up in 1 year at the time of the patient's annual
bilateral mammogram.
2. No mammographic evidence of malignancy on the left.

RECOMMENDATION:
Bilateral diagnostic mammogram in 1 year.

I have discussed the findings and recommendations with the patient.
Results were also provided in writing at the conclusion of the
visit. If applicable, a reminder letter will be sent to the patient
regarding the next appointment.

BI-RADS CATEGORY  3: Probably benign.

## 2020-03-13 IMAGING — DX PORTABLE LEFT ANKLE - 2 VIEW
1 series · 2 of 2 positions shown · non-contrast
Comparison: 08/11/2018

CLINICAL DATA: Postreduction

EXAM:
PORTABLE LEFT ANKLE - 2 VIEW

[Series 1: ankle · 0.14mm/px · 2 of 2 slices shown]
[im 1/2]
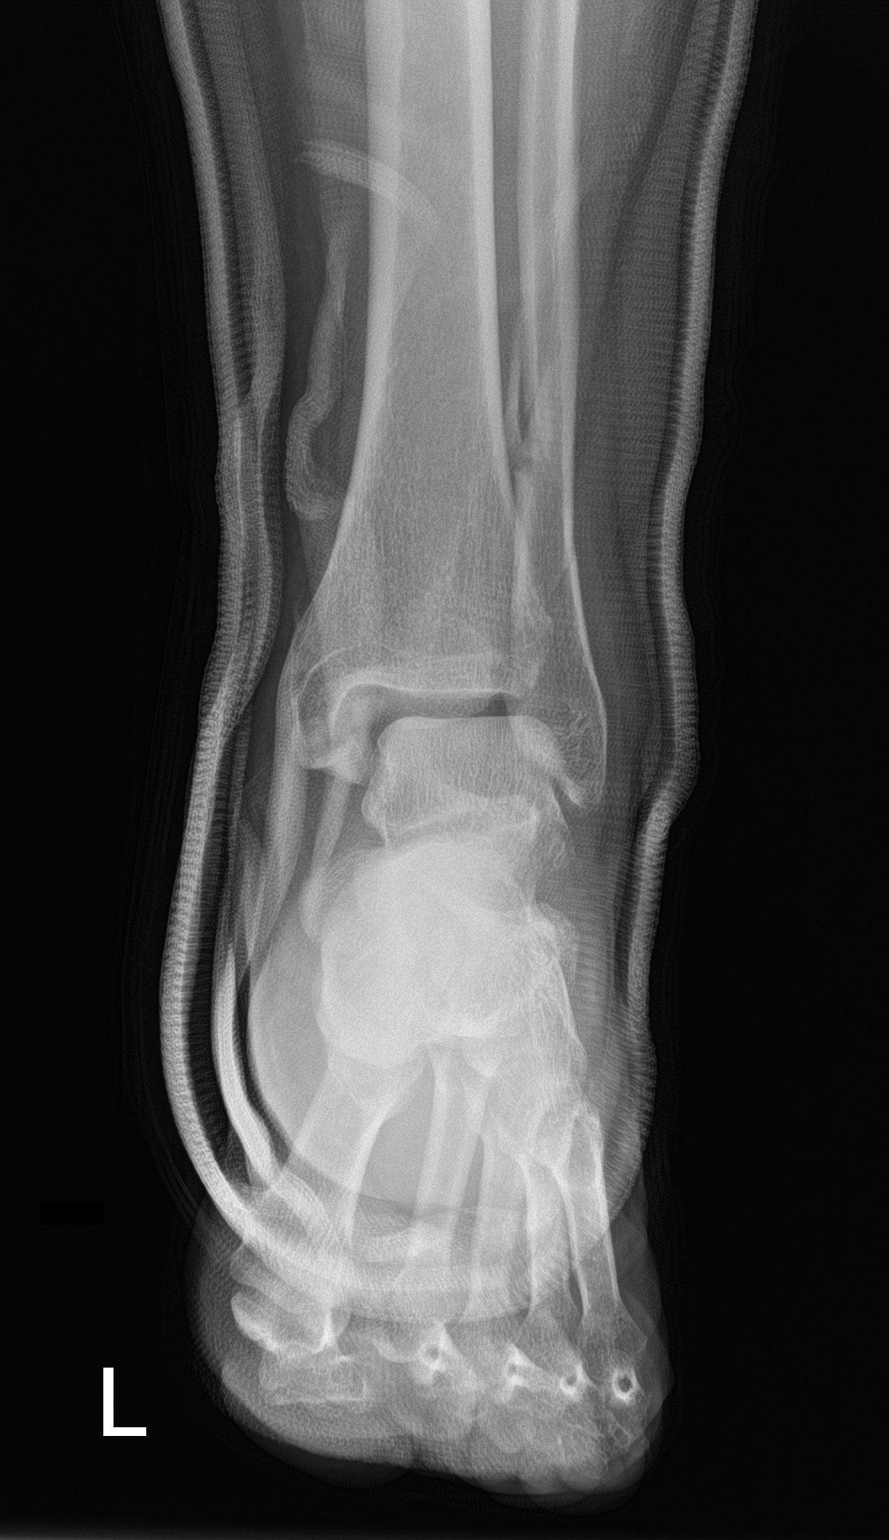
[im 2/2]
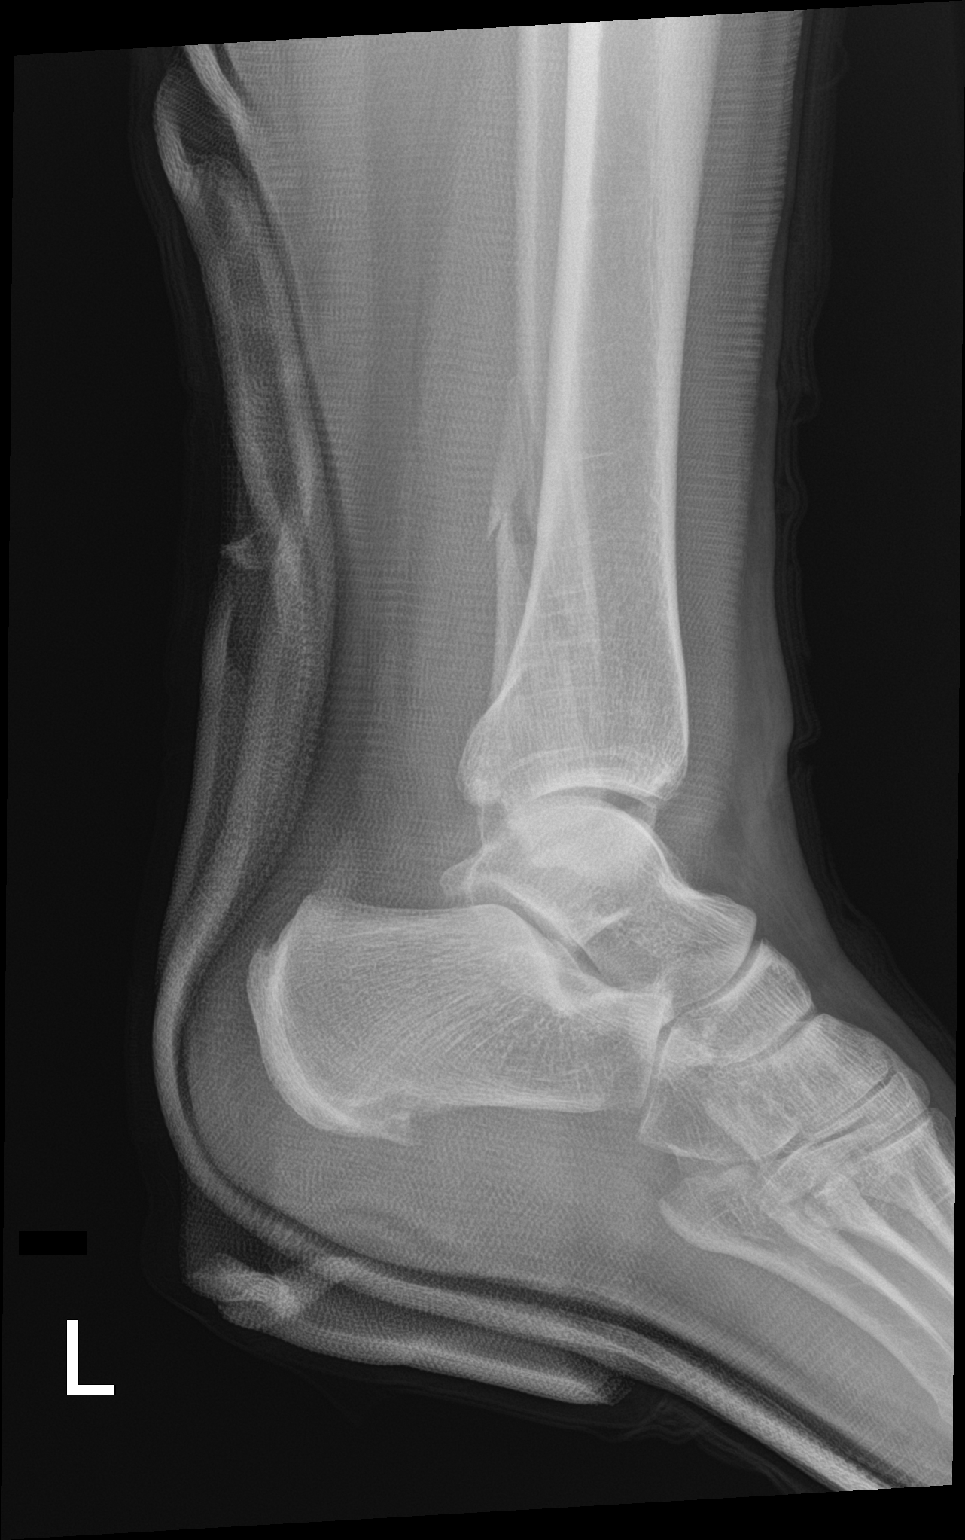

[2 of 2 positions shown; findings below may reference images not displayed]

FINDINGS: Interval reduction of the fracture dislocation of the left ankle. No
dislocation currently. Distal fibular fracture fragments remain
mildly displaced but markedly improved in alignment. Improved
alignment of the distal tibial fractures.
IMPRESSION: Interval reduction and improved alignment.  Overlying cast material.
